# Patient Record
Sex: Female | Born: 1965 | Race: Black or African American | Hispanic: No | Marital: Married | Smoking: Never smoker
Health system: Southern US, Community
[De-identification: ages and names within clinical notes are randomized; demographics above are authoritative.]

## PROBLEM LIST (undated history)

## (undated) DIAGNOSIS — M542 Cervicalgia: Secondary | ICD-10-CM

## (undated) DIAGNOSIS — I10 Essential (primary) hypertension: Secondary | ICD-10-CM

## (undated) DIAGNOSIS — Z5189 Encounter for other specified aftercare: Secondary | ICD-10-CM

## (undated) DIAGNOSIS — E669 Obesity, unspecified: Secondary | ICD-10-CM

## (undated) DIAGNOSIS — J45909 Unspecified asthma, uncomplicated: Secondary | ICD-10-CM

## (undated) DIAGNOSIS — I509 Heart failure, unspecified: Secondary | ICD-10-CM

## (undated) DIAGNOSIS — G8929 Other chronic pain: Secondary | ICD-10-CM

## (undated) DIAGNOSIS — D649 Anemia, unspecified: Secondary | ICD-10-CM

## (undated) DIAGNOSIS — R202 Paresthesia of skin: Secondary | ICD-10-CM

## (undated) DIAGNOSIS — J40 Bronchitis, not specified as acute or chronic: Secondary | ICD-10-CM

---

## 2005-07-04 ENCOUNTER — Emergency Department (HOSPITAL_COMMUNITY): Admission: EM | Admit: 2005-07-04 | Discharge: 2005-07-04 | Payer: Self-pay | Admitting: Emergency Medicine

## 2006-04-13 ENCOUNTER — Emergency Department (HOSPITAL_COMMUNITY): Admission: EM | Admit: 2006-04-13 | Discharge: 2006-04-13 | Payer: Self-pay | Admitting: Emergency Medicine

## 2006-06-27 ENCOUNTER — Emergency Department (HOSPITAL_COMMUNITY): Admission: EM | Admit: 2006-06-27 | Discharge: 2006-06-27 | Payer: Self-pay | Admitting: Emergency Medicine

## 2007-08-28 ENCOUNTER — Emergency Department (HOSPITAL_COMMUNITY): Admission: EM | Admit: 2007-08-28 | Discharge: 2007-08-28 | Payer: Self-pay | Admitting: Emergency Medicine

## 2007-09-25 ENCOUNTER — Emergency Department (HOSPITAL_COMMUNITY): Admission: EM | Admit: 2007-09-25 | Discharge: 2007-09-26 | Payer: Self-pay | Admitting: Emergency Medicine

## 2008-01-31 ENCOUNTER — Emergency Department (HOSPITAL_COMMUNITY): Admission: EM | Admit: 2008-01-31 | Discharge: 2008-02-01 | Payer: Self-pay | Admitting: Emergency Medicine

## 2008-08-10 ENCOUNTER — Emergency Department (HOSPITAL_COMMUNITY): Admission: EM | Admit: 2008-08-10 | Discharge: 2008-08-10 | Payer: Self-pay | Admitting: Emergency Medicine

## 2008-08-11 ENCOUNTER — Emergency Department (HOSPITAL_COMMUNITY): Admission: EM | Admit: 2008-08-11 | Discharge: 2008-08-12 | Payer: Self-pay | Admitting: Emergency Medicine

## 2008-12-09 ENCOUNTER — Emergency Department (HOSPITAL_COMMUNITY): Admission: EM | Admit: 2008-12-09 | Discharge: 2008-12-09 | Payer: Self-pay | Admitting: Family Medicine

## 2009-04-11 ENCOUNTER — Emergency Department (HOSPITAL_COMMUNITY): Admission: EM | Admit: 2009-04-11 | Discharge: 2009-04-11 | Payer: Self-pay | Admitting: Emergency Medicine

## 2009-08-10 ENCOUNTER — Emergency Department (HOSPITAL_COMMUNITY): Admission: EM | Admit: 2009-08-10 | Discharge: 2009-08-10 | Payer: Self-pay | Admitting: Emergency Medicine

## 2009-12-19 DIAGNOSIS — J40 Bronchitis, not specified as acute or chronic: Secondary | ICD-10-CM

## 2009-12-19 HISTORY — DX: Bronchitis, not specified as acute or chronic: J40

## 2010-02-14 ENCOUNTER — Emergency Department (HOSPITAL_COMMUNITY): Admission: EM | Admit: 2010-02-14 | Discharge: 2010-02-14 | Payer: Self-pay | Admitting: Dermatology

## 2010-05-31 ENCOUNTER — Emergency Department (HOSPITAL_COMMUNITY): Admission: EM | Admit: 2010-05-31 | Discharge: 2010-06-01 | Payer: Self-pay | Admitting: Emergency Medicine

## 2010-07-20 ENCOUNTER — Emergency Department (HOSPITAL_COMMUNITY): Admission: EM | Admit: 2010-07-20 | Discharge: 2010-07-20 | Payer: Self-pay | Admitting: Emergency Medicine

## 2010-09-02 ENCOUNTER — Emergency Department (HOSPITAL_COMMUNITY): Admission: EM | Admit: 2010-09-02 | Discharge: 2010-09-02 | Payer: Self-pay | Admitting: Emergency Medicine

## 2010-09-12 ENCOUNTER — Emergency Department (HOSPITAL_COMMUNITY): Admission: EM | Admit: 2010-09-12 | Discharge: 2010-09-12 | Payer: Self-pay | Admitting: Emergency Medicine

## 2010-10-09 ENCOUNTER — Ambulatory Visit: Payer: Self-pay | Admitting: Nurse Practitioner

## 2010-10-09 ENCOUNTER — Emergency Department (HOSPITAL_COMMUNITY): Admission: EM | Admit: 2010-10-09 | Discharge: 2010-10-09 | Payer: Self-pay | Admitting: Emergency Medicine

## 2010-11-06 ENCOUNTER — Emergency Department (HOSPITAL_COMMUNITY): Admission: EM | Admit: 2010-11-06 | Discharge: 2010-11-07 | Payer: Self-pay | Admitting: Emergency Medicine

## 2010-12-03 ENCOUNTER — Emergency Department (HOSPITAL_COMMUNITY)
Admission: EM | Admit: 2010-12-03 | Discharge: 2010-12-03 | Payer: Self-pay | Source: Home / Self Care | Admitting: Emergency Medicine

## 2010-12-05 ENCOUNTER — Emergency Department (HOSPITAL_COMMUNITY)
Admission: EM | Admit: 2010-12-05 | Discharge: 2010-12-05 | Payer: Self-pay | Source: Home / Self Care | Admitting: Emergency Medicine

## 2010-12-05 ENCOUNTER — Encounter: Payer: Self-pay | Admitting: Ophthalmology

## 2010-12-14 ENCOUNTER — Emergency Department (HOSPITAL_COMMUNITY)
Admission: EM | Admit: 2010-12-14 | Discharge: 2010-12-14 | Payer: Self-pay | Source: Home / Self Care | Admitting: Emergency Medicine

## 2010-12-19 ENCOUNTER — Emergency Department (HOSPITAL_COMMUNITY)
Admission: EM | Admit: 2010-12-19 | Discharge: 2010-12-19 | Payer: Self-pay | Source: Home / Self Care | Admitting: Emergency Medicine

## 2010-12-26 ENCOUNTER — Emergency Department (HOSPITAL_COMMUNITY)
Admission: EM | Admit: 2010-12-26 | Discharge: 2010-12-26 | Payer: Self-pay | Source: Home / Self Care | Admitting: Emergency Medicine

## 2010-12-31 ENCOUNTER — Other Ambulatory Visit: Payer: Self-pay | Admitting: Internal Medicine

## 2010-12-31 ENCOUNTER — Emergency Department (HOSPITAL_COMMUNITY)
Admission: EM | Admit: 2010-12-31 | Discharge: 2010-12-31 | Payer: Self-pay | Source: Home / Self Care | Admitting: Emergency Medicine

## 2010-12-31 ENCOUNTER — Other Ambulatory Visit
Admission: RE | Admit: 2010-12-31 | Discharge: 2010-12-31 | Payer: Self-pay | Source: Home / Self Care | Admitting: Family Medicine

## 2011-01-03 LAB — URINALYSIS, ROUTINE W REFLEX MICROSCOPIC
Bilirubin Urine: NEGATIVE
Bilirubin Urine: NEGATIVE
Hgb urine dipstick: NEGATIVE
Ketones, ur: NEGATIVE mg/dL
Ketones, ur: NEGATIVE mg/dL
Leukocytes, UA: NEGATIVE
Nitrite: NEGATIVE
Nitrite: NEGATIVE
Protein, ur: NEGATIVE mg/dL
Protein, ur: >300 mg/dL — AB
Specific Gravity, Urine: 1.015 (ref 1.005–1.030)
Specific Gravity, Urine: 1.021 (ref 1.005–1.030)
Urine Glucose, Fasting: NEGATIVE mg/dL
Urine Glucose, Fasting: NEGATIVE mg/dL
Urobilinogen, UA: 0.2 mg/dL (ref 0.0–1.0)
Urobilinogen, UA: 0.2 mg/dL (ref 0.0–1.0)
pH: 6 (ref 5.0–8.0)
pH: 6 (ref 5.0–8.0)

## 2011-01-03 LAB — URINE MICROSCOPIC-ADD ON

## 2011-01-03 LAB — DIFFERENTIAL
Basophils Absolute: 0.1 10*3/uL (ref 0.0–0.1)
Basophils Absolute: 0.1 10*3/uL (ref 0.0–0.1)
Basophils Relative: 1 % (ref 0–1)
Basophils Relative: 1 % (ref 0–1)
Eosinophils Absolute: 0.4 10*3/uL (ref 0.0–0.7)
Eosinophils Absolute: 0.4 10*3/uL (ref 0.0–0.7)
Eosinophils Relative: 5 % (ref 0–5)
Eosinophils Relative: 5 % (ref 0–5)
Lymphocytes Relative: 18 % (ref 12–46)
Lymphocytes Relative: 18 % (ref 12–46)
Lymphs Abs: 1.4 10*3/uL (ref 0.7–4.0)
Lymphs Abs: 1.5 10*3/uL (ref 0.7–4.0)
Monocytes Absolute: 1 10*3/uL (ref 0.1–1.0)
Monocytes Absolute: 0.8 10*3/uL (ref 0.1–1.0)
Monocytes Relative: 13 % — ABNORMAL HIGH (ref 3–12)
Monocytes Relative: 10 % (ref 3–12)
Neutro Abs: 4.9 10*3/uL (ref 1.7–7.7)
Neutro Abs: 5.4 10*3/uL (ref 1.7–7.7)
Neutrophils Relative %: 63 % (ref 43–77)
Neutrophils Relative %: 66 % (ref 43–77)

## 2011-01-03 LAB — CBC
HCT: 27.6 % — ABNORMAL LOW (ref 36.0–46.0)
HCT: 30.6 % — ABNORMAL LOW (ref 36.0–46.0)
Hemoglobin: 9 g/dL — ABNORMAL LOW (ref 12.0–15.0)
Hemoglobin: 8.4 g/dL — ABNORMAL LOW (ref 12.0–15.0)
MCH: 23.5 pg — ABNORMAL LOW (ref 26.0–34.0)
MCH: 20.1 pg — ABNORMAL LOW (ref 26.0–34.0)
MCHC: 32.6 g/dL (ref 30.0–36.0)
MCHC: 27.5 g/dL — ABNORMAL LOW (ref 30.0–36.0)
MCV: 72.1 fL — ABNORMAL LOW (ref 78.0–100.0)
MCV: 73.4 fL — ABNORMAL LOW (ref 78.0–100.0)
Platelets: 335 10*3/uL (ref 150–400)
Platelets: 362 10*3/uL (ref 150–400)
RBC: 3.83 MIL/uL — ABNORMAL LOW (ref 3.87–5.11)
RBC: 4.17 MIL/uL (ref 3.87–5.11)
RDW: 17.9 % — ABNORMAL HIGH (ref 11.5–15.5)
RDW: 17.8 % — ABNORMAL HIGH (ref 11.5–15.5)
WBC: 7.7 10*3/uL (ref 4.0–10.5)
WBC: 8.2 10*3/uL (ref 4.0–10.5)

## 2011-01-03 LAB — COMPREHENSIVE METABOLIC PANEL
ALT: 21 U/L (ref 0–35)
ALT: 18 U/L (ref 0–35)
AST: 29 U/L (ref 0–37)
AST: 19 U/L (ref 0–37)
Albumin: 3 g/dL — ABNORMAL LOW (ref 3.5–5.2)
Albumin: 2.9 g/dL — ABNORMAL LOW (ref 3.5–5.2)
Alkaline Phosphatase: 45 U/L (ref 39–117)
Alkaline Phosphatase: 47 U/L (ref 39–117)
BUN: 8 mg/dL (ref 6–23)
BUN: 8 mg/dL (ref 6–23)
CO2: 29 mEq/L (ref 19–32)
CO2: 28 mEq/L (ref 19–32)
Calcium: 8.5 mg/dL (ref 8.4–10.5)
Calcium: 8.9 mg/dL (ref 8.4–10.5)
Chloride: 102 mEq/L (ref 96–112)
Chloride: 103 mEq/L (ref 96–112)
Creatinine, Ser: 1.07 mg/dL (ref 0.4–1.2)
Creatinine, Ser: 1.04 mg/dL (ref 0.4–1.2)
GFR calc Af Amer: >60 mL/min (ref >60)
GFR calc Af Amer: >60 mL/min (ref >60)
GFR calc non Af Amer: 55 mL/min — ABNORMAL LOW (ref >60)
GFR calc non Af Amer: 57 mL/min — ABNORMAL LOW (ref >60)
Glucose, Bld: 131 mg/dL — ABNORMAL HIGH (ref 70–99)
Glucose, Bld: 119 mg/dL — ABNORMAL HIGH (ref 70–99)
Potassium: 3.8 mEq/L (ref 3.5–5.1)
Potassium: 3.9 mEq/L (ref 3.5–5.1)
Sodium: 139 mEq/L (ref 135–145)
Sodium: 139 mEq/L (ref 135–145)
Total Bilirubin: 0.5 mg/dL (ref 0.3–1.2)
Total Bilirubin: 0.7 mg/dL (ref 0.3–1.2)
Total Protein: 6.6 g/dL (ref 6.0–8.3)
Total Protein: 5.9 g/dL — ABNORMAL LOW (ref 6.0–8.3)

## 2011-01-03 LAB — CK TOTAL AND CKMB (NOT AT ARMC)
CK, MB: 1.7 ng/mL (ref 0.3–4.0)
Relative Index: 1.3 (ref 0.0–2.5)
Total CK: 131 U/L (ref 7–177)

## 2011-01-03 LAB — BRAIN NATRIURETIC PEPTIDE
Pro B Natriuretic peptide (BNP): 171 pg/mL — ABNORMAL HIGH (ref 0.0–100.0)
Pro B Natriuretic peptide (BNP): 310 pg/mL — ABNORMAL HIGH (ref 0.0–100.0)

## 2011-01-03 LAB — TROPONIN I: Troponin I: 0.04 ng/mL (ref 0.00–0.06)

## 2011-01-03 LAB — PATHOLOGIST SMEAR REVIEW

## 2011-01-04 ENCOUNTER — Inpatient Hospital Stay (HOSPITAL_COMMUNITY): Admission: EM | Admit: 2011-01-04 | Discharge: 2011-01-09 | Payer: Self-pay | Source: Home / Self Care

## 2011-01-06 ENCOUNTER — Encounter (INDEPENDENT_AMBULATORY_CARE_PROVIDER_SITE_OTHER): Payer: Self-pay | Admitting: Internal Medicine

## 2011-01-10 LAB — DIFFERENTIAL
Basophils Absolute: 0 10*3/uL (ref 0.0–0.1)
Basophils Relative: 1 % (ref 0–1)
Eosinophils Absolute: 0.5 10*3/uL (ref 0.0–0.7)
Eosinophils Absolute: 0.5 10*3/uL (ref 0.0–0.7)
Eosinophils Relative: 6 % — ABNORMAL HIGH (ref 0–5)
Lymphocytes Relative: 20 % (ref 12–46)
Lymphs Abs: 1.6 10*3/uL (ref 0.7–4.0)
Lymphs Abs: 0.9 10*3/uL (ref 0.7–4.0)
Monocytes Absolute: 1 10*3/uL (ref 0.1–1.0)
Monocytes Relative: 12 % (ref 3–12)
Monocytes Relative: 13 % — ABNORMAL HIGH (ref 3–12)
Neutro Abs: 4.9 10*3/uL (ref 1.7–7.7)
Neutrophils Relative %: 62 % (ref 43–77)
Neutrophils Relative %: 64 % (ref 43–77)

## 2011-01-10 LAB — BASIC METABOLIC PANEL
BUN: 4 mg/dL — ABNORMAL LOW (ref 6–23)
BUN: 8 mg/dL (ref 6–23)
CO2: 32 mEq/L (ref 19–32)
Calcium: 8.8 mg/dL (ref 8.4–10.5)
Chloride: 99 mEq/L (ref 96–112)
Chloride: 98 mEq/L (ref 96–112)
GFR calc Af Amer: >60 mL/min (ref >60)
GFR calc non Af Amer: 56 mL/min — ABNORMAL LOW (ref >60)
Glucose, Bld: 145 mg/dL — ABNORMAL HIGH (ref 70–99)
Potassium: 3.7 mEq/L (ref 3.5–5.1)
Potassium: 3.3 mEq/L — ABNORMAL LOW (ref 3.5–5.1)
Potassium: 3.6 mEq/L (ref 3.5–5.1)
Sodium: 140 mEq/L (ref 135–145)

## 2011-01-10 LAB — HEPATIC FUNCTION PANEL
ALT: 24 U/L (ref 0–35)
AST: 24 U/L (ref 0–37)
Albumin: 2.9 g/dL — ABNORMAL LOW (ref 3.5–5.2)
Total Protein: 6.5 g/dL (ref 6.0–8.3)

## 2011-01-10 LAB — CBC
HCT: 30.8 % — ABNORMAL LOW (ref 36.0–46.0)
Hemoglobin: 8.8 g/dL — ABNORMAL LOW (ref 12.0–15.0)
Hemoglobin: 9.1 g/dL — ABNORMAL LOW (ref 12.0–15.0)
MCH: 21.1 pg — ABNORMAL LOW (ref 26.0–34.0)
MCH: 20.9 pg — ABNORMAL LOW (ref 26.0–34.0)
MCHC: 28.6 g/dL — ABNORMAL LOW (ref 30.0–36.0)
MCV: 73.9 fL — ABNORMAL LOW (ref 78.0–100.0)
MCV: 73.4 fL — ABNORMAL LOW (ref 78.0–100.0)
Platelets: 412 10*3/uL — ABNORMAL HIGH (ref 150–400)
RBC: 4.17 MIL/uL (ref 3.87–5.11)
RBC: 4.36 MIL/uL (ref 3.87–5.11)
RDW: 18.9 % — ABNORMAL HIGH (ref 11.5–15.5)
WBC: 8 10*3/uL (ref 4.0–10.5)

## 2011-01-10 LAB — IRON AND TIBC: UIBC: >450 ug/dL

## 2011-01-10 LAB — VITAMIN B12: Vitamin B-12: 497 pg/mL (ref 211–911)

## 2011-01-10 LAB — CARDIAC PANEL(CRET KIN+CKTOT+MB+TROPI)
CK, MB: 1.9 ng/mL (ref 0.3–4.0)
CK, MB: 2.1 ng/mL (ref 0.3–4.0)
CK, MB: 1.9 ng/mL (ref 0.3–4.0)
Relative Index: INVALID (ref 0.0–2.5)
Total CK: 85 U/L (ref 7–177)
Troponin I: 0.03 ng/mL (ref 0.00–0.06)

## 2011-01-10 LAB — URINALYSIS, ROUTINE W REFLEX MICROSCOPIC
Bilirubin Urine: NEGATIVE
Hgb urine dipstick: NEGATIVE
Ketones, ur: NEGATIVE mg/dL
Leukocytes, UA: NEGATIVE
Nitrite: NEGATIVE
Protein, ur: 30 mg/dL — AB
Specific Gravity, Urine: >1.03 — ABNORMAL HIGH (ref 1.005–1.030)
Urine Glucose, Fasting: NEGATIVE mg/dL
Urobilinogen, UA: 0.2 mg/dL (ref 0.0–1.0)
pH: 5.5 (ref 5.0–8.0)

## 2011-01-10 LAB — FOLATE: Folate: 15.3 ng/mL

## 2011-01-10 LAB — FERRITIN: Ferritin: 8 ng/mL — ABNORMAL LOW (ref 10–291)

## 2011-01-10 LAB — PREGNANCY, URINE: Preg Test, Ur: NEGATIVE

## 2011-01-10 LAB — BRAIN NATRIURETIC PEPTIDE: Pro B Natriuretic peptide (BNP): 255 pg/mL — ABNORMAL HIGH (ref 0.0–100.0)

## 2011-01-10 LAB — URINE MICROSCOPIC-ADD ON

## 2011-01-10 NOTE — Discharge Summary (Addendum)
[name], HARDRICK               ACCOUNT NO.:  [id number]  MEDICAL RECORD NO.:  [id number]          PATIENT TYPE:  INP  LOCATION:  A218                          FACILITY:  [facility]  PHYSICIAN:  Wilson Singer, M.D.DATE OF BIRTH:  1966-06-03  DATE OF ADMISSION:  01/05/2011 DATE OF DISCHARGE:  01/22/2012LH                              DISCHARGE SUMMARY   FINAL DISCHARGE DIAGNOSES: 1. Systolic congestive heart failure, ejection fraction 28%. 2. Iron deficiency anemia secondary to menorrhagia. 3. Obesity. 4. Hypertension.  CONDITION ON DISCHARGE:  Stable.  MEDICATIONS ON DISCHARGE: 1. Aspirin 81 mg daily. 2. Carvedilol 3.125 mg b.i.d. 3. Furosemide 40 mg b.i.d. 4. Ferrous sulfate 325 mg daily. 5. Lisinopril 10 mg daily. 6. Potassium chloride 40 mEq daily. 7. Spironolactone 25 mg daily. 8. Ondansetron 8 mg every 4 hours p.r.n. 9. Tussionex extended release oral suspension 1 teaspoon every 12     hours p.r.n.  HISTORY:  This very pleasant 45 year old lady who was admitted to the hospital complaining of leg swelling, abdominal swelling, 44-month to 1- year history of dyspnea on exertion, orthopnea, and paroxysmal nocturnal dyspnea.  Please see initial history and examination done by Dr. Donnalee Curry.  HOSPITAL PROGRESS:  The patient was admitted for her peripheral edema and anasarca.  She was diuresed with intravenous Lasix.  There was a suspicion that in fact this lady may have congestive heart failure. Echocardiogram was done which showed that there was severely reduced left ventricular systolic function with ejection fraction between 20-30% with severe global hypokinesis and in this regard, she was then seen by Dr. Dietrich Pates who agreed and made additions to medications.  She also was found to have an iron deficiency anemia which is likely due to her menorrhagia that she still continues to have.  Fecal alcohol blood test were negative and she does not have any history of  GI bleeding.  During her hospitalization, she has lost at least 10 kg.  They do not have today's weight, but the most recent weight shows that she weighed 138 kg from admission weight of 145 kg.  She says that her baseline weight is approximately 260 pounds.  On the day of discharge, she looked well. She was not dyspneic at rest and was keen to go home.  PHYSICAL EXAMINATION:  VITAL SIGNS:  Temperature 98.1, blood pressure 112/66, pulse 89, saturation 94% on room air. CARDIOVASCULAR:  Heart sounds are present and normal without murmurs. Jugular venous pressure was not raised today. LUNGS:  Lung fields are clear.  Discharging blood work shows sodium 136, potassium 3.7, bicarbonate 28, BUN 10, creatinine 1.05.  Interestingly, her TSH was marginally elevated at 6.96 and this should be followed by primary care physician, Dr. Nehemiah Settle.  DISPOSITION:  The patient is stable now to be discharged home and followup must be with the cardiologist in about 1 week and primary care physician in 2 weeks.  She will need to continue her medications and followup as appropriate.     Wilson Singer, M.D.     NCG/MEDQ  D:  01/09/2011  T:  01/09/2011  Job:  [id number]  cc:  Deirdre Peer. Polite, M.D.  Gerrit Friends. Dietrich Pates, MD, [facility] [address] [place], [place] [address]  Electronically Signed by Lilly Cove M.D. on 01/10/2011 12:57:36 PM

## 2011-01-11 LAB — HEPATIC FUNCTION PANEL
AST: 19 U/L (ref 0–37)
Albumin: 3.2 g/dL — ABNORMAL LOW (ref 3.5–5.2)
Alkaline Phosphatase: 45 U/L (ref 39–117)
Total Bilirubin: 0.5 mg/dL (ref 0.3–1.2)

## 2011-01-11 LAB — BASIC METABOLIC PANEL
BUN: 10 mg/dL (ref 6–23)
Chloride: 100 mEq/L (ref 96–112)
Glucose, Bld: 104 mg/dL — ABNORMAL HIGH (ref 70–99)
Potassium: 3.7 mEq/L (ref 3.5–5.1)

## 2011-01-13 NOTE — H&P (Addendum)
[name], [name]               ACCOUNT NO.:  [id number]  MEDICAL RECORD NO.:  [id number]          PATIENT TYPE:  INP  LOCATION:  A218                          FACILITY:  [facility]  PHYSICIAN:  Kela Millin, M.D.DATE OF BIRTH:  1966-11-25  DATE OF ADMISSION:  01/04/2011 DATE OF DISCHARGE:  LH                             HISTORY & PHYSICAL   PRIMARY CARE PHYSICIAN:  Dr. Renford Dills.  CHIEF COMPLAINT:  Abdominal pain, also leg swelling.  HISTORY OF PRESENT ILLNESS:  The patient is a 45 year old morbidly obese black female who presents with the above complaints.  She states that for the past 3 days she has had the diffuse abdominal pain described as sharp, constant and worsened early this a.m. when she came to the ED to 10 out of 10 in intensity.  She admits to associated nausea and vomiting, stating that each time she was tried to eat she would vomit, nonbloody.  She denies diarrhea.  She states that a couple of weeks ago they all had a virus in her household and that was associated with diarrhea.  She admits to a cough productive of phlegm, and subjective fevers.  She denies dysuria.  The patient also reports that she has had leg swelling for about 1 month now and about 3 weeks ago she was started on Lasix by her primary care physician.  She admits to PND, stating that she has had to be sleeping in a chair lately because of that.  She also admits to dyspnea on exertion.  She denies chest pain, dizziness, focal weakness and no blurry vision.  She admits to menorrhagia.  She denies melena and no hematochezia.  The patient was seen in the ED and a CT scan of her abdomen was done and revealed diffuse soft tissue edema consistent with anasarca.  No evidence of bowel obstruction or focal fluid collection.  Hepatic steatosis.  A brain natriuretic peptide was done and it was 255.  Her lipase within normal limits at 19 and her LFTs unremarkable except for an albumin of 2.9, and her  renal function within normal limits.  Her urinalysis was negative for infection.  She is admitted for further evaluation and management.  PAST MEDICAL HISTORY: 1. Hypertension. 2. History of microcytic anemia. 3. As above.  MEDICATIONS: 1. Zofran. 2. KCl 20 mEq daily. 3. Lasix, she does not know the dose. 4. Tussionex 10 mL p.r.n. 5. Iron 27 mg daily. 6. Blood pressure pill.  ALLERGIES:  NKDA.  SOCIAL HISTORY:  She denies tobacco.  She denies alcohol.  FAMILY HISTORY:  Her mom and dad have hypertension, her dad has diabetes and her mom is status post pacemaker.  REVIEW OF SYSTEMS:  As per HPI. Other review of systems negative.  PHYSICAL EXAMINATION:  GENERAL:  The patient is a morbidly obese black female, in no respiratory distress. VITAL SIGNS: Temperature is 98.9 with a blood pressure of 131/68, pulse 108, respiratory rate is 20, O2 sat of 96%. HEENT:  PERRL, EOMI, moist mucous membranes.  No oral exudates. NECK:  Supple, no adenopathy, no thyromegaly and no JVD. LUNGS:  Decreased breath  sounds at the bases, no wheezes and no crackles appreciated. CARDIOVASCULAR:  Regular rate and rhythm, normal S1 and S2. ABDOMEN:  Obese, marked abdominal wall edema, epigastric/abdominal tenderness present, no rebound tenderness.  No masses palpable. EXTREMITIES:  Tender, about +2 edema.  No cyanosis. NEURO:  She is alert and oriented x3.  Cranial nerves II-XII grossly intact.  Nonfocal exam.  LABORATORY DATA:  Her urinalysis is negative for infection.  White cell count is 8 with a hemoglobin of 8.8, hematocrit of 30.8, platelet count 412.  Her MCV 73.9  Sodium is 140 with a potassium of 3.7, chloride of 105, glucose of 98, creatinine of 1.05, calcium is 8.8.  Her total bilirubin is 0.4, direct bilirubin 0.2, indirect is 0.2, alkaline phosphatase is 46, SGOT is 24, SGPT is 24, albumin 2.9, lipase is 19, brain natriuretic peptide is 255.  ASSESSMENT AND PLAN: 1. Anasarca:  As  discussed above, this patient presenting with dyspnea     on exertion, PND, peripheral edema and abdominal wall edema on     exam.  Her brain natriuretic peptide is only mildly elevated at 255     but it is noted that she has been on Lasix for the past 3 weeks.     Her LFTs and renal function are within normal limits.  Will obtain     a 2-D echo, cardiac enzymes, follow and further recommendations     pending at the studies.  Will continue diuresing with Lasix at this     time.  If the above workup negative, she may need a sleep study     outpatient to evaluate for possible sleep apnea. 2. Abdominal pain, gastroesophageal reflux disease versus     gastroenteritis.  She has epigastric tenderness on exam.  Her     lipase is within normal limits as well as her liver function tests     and the urinalysis shows no infection.  The CT scan of her abdomen     is as above with hepatic steatosis and anasarca.  Will place the     patient on a PPI, supportive care and follow. 3. Hypertension.  Monitor and further treat accordingly.  She does not     recall the name of her blood pressure     medication.  Obtain and resume. 4. Microcytic anemia.  She admits to menorrhagia, but will also check     an anemia panel, stool guaiacs, follow and further treat     accordingly.     Kela Millin, M.D.     ACV/MEDQ  D:  01/05/2011  T:  01/05/2011  Job:  [id number]  cc:   Deirdre Peer. Polite, M.D.  Electronically Signed by Donnalee Curry M.D. on 01/13/2011 09:23:51 AM

## 2011-01-24 NOTE — Consult Note (Addendum)
[name], [name] NO.:  [id number]  MEDICAL RECORD NO.:  [id number]          PATIENT TYPE:  INP  LOCATION:  A218                          FACILITY:  [facility]  PHYSICIAN:  Gerrit Friends. Dietrich Pates, MD, FACCDATE OF BIRTH:  04-15-1966  DATE OF CONSULTATION:  01/07/2011 DATE OF DISCHARGE:                                CONSULTATION   REASON FOR CONSULTATION:  Cardiomyopathy with an EF of 28% for further recommendations.  HISTORY OF PRESENT ILLNESS:  This is a 45 year old morbidly obese African American female with no prior cardiac history, who was admitted with anasarca.  She had been placed on IV Lasix 40 mg q.8 hours, with good diuresis with a weight decrease of 8 kg.  Echocardiogram was completed during this admission on January 06, 2011, revealing an EF of 20-30% with LVH.  We were requested for consultation to make recommendations and questionable need for catheterization.  The patient has been seen in the emergency room multiple times over the last year and has also been seen at [facility], where she was admitted.  She states she had an echocardiogram at that time.  We will request records for that.  The patient states that she was told that she had bronchitis and some mild fluid overloaded, and was placed on some p.o. Lasix.  However, her symptoms persisted and became substantially worse over the last few weeks.  Unable to lay down in the bed or breathe well, with increasing lower extremity edema up into her thighs.  As a result, she came to the emergency room.  On arrival, the patient's EKG revealed sinus tachycardia.  She was also found to have mild pulmonary vascular congestion, but no pulmonary edema.  REVIEW OF SYSTEMS:  Positive for shortness of breath, dyspnea on exertion, orthopnea, edema and cough.  All other systems were reviewed and found to be negative, unless listed above.  CODE STATUS:  Full.  PAST MEDICAL HISTORY:  Recently diagnosed  with hypertension within the last 6 months, recent diagnosis of microcytic anemia, diagnosis of bronchitis.  PAST SURGICAL HISTORY:  C-section, echocardiogram dated January 06, 2011 with EF 20-30% with severe global hypokinesis.  SOCIAL HISTORY:  She lives in [place] with her husband.  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  She is married with three children. She has never smoked, drank or used drugs.  FAMILY HISTORY:  Mother has recently had a pacemaker placed for tachybrady syndrome.  Father with hypertension and CAD, with stent placement.  She has one brother with diabetes.  CURRENT MEDICATIONS PRIOR TO ADMISSION:  Lasix 80 mg daily, potassium 20 mEq daily and Tussionex.  ALLERGIES:  NO KNOWN DRUG ALLERGIES.  CURRENT LABS:  Sodium 138, potassium 3.3, chloride 99, CO2 32, BUN 4, creatinine 1.0, glucose 139.  Hemoglobin 9.1, hematocrit 32.0, white blood cells 6.1, platelets 455.  BNP on admit 255.  Troponin 0.05, 0.04 and 0.03 respectively.  EKG revealing sinus tachycardia, with PACs, rate of 106 beats per minute.  Chest x-ray revealing a enlarged cardiac silhouette, with pulmonary vascular congestion.  PHYSICAL EXAMINATION:  VITAL SIGNS: Blood pressure 148/90, heart  rate 129, respirations 20, temperature 97.7, O2 sat 92% on room air.  Current weight 137.9 kg (admission weight 145.2 kg). GENERAL: She is awake, alert and oriented, in no acute distress. HEENT: Head is normocephalic and atraumatic.  Eyes:  PERRLA. NECK: Supple.  Obese.  No JVD.  No carotid bruits appreciated. CARDIOVASCULAR: Regular rate and rhythm, tachycardic.  Distant heart sounds.  No rubs or gallops noted..  Pulses are 2+ and equal. LUNGS: Some mild crackles, which are not very prominent.  Lungs essentially otherwise clear to auscultation. ABDOMEN: Soft and nontender, with 2+ bowel sounds.  No distention or ascites. EXTREMITIES: 2+ pitting edema up to the knees  bilaterally. MUSCULOSKELETAL: No joint deformity or effusions. NEURO: Cranial nerves II-XII are grossly intact.  IMPRESSION: 1. Acute systolic congestive heart failure:  Rule out hypertensive     cardiomyopathy versus ischemia.  Negative cardiac enzymes at this     time.  Will add Coreg 3.25 mg b.i.d. for heart rate control.  Also     add spirolactone.  She is diuresing well, but may need to add     inotrope for better diuresis and cardiac output showed her diuresis     slow.  For now keep her on IV Lasix.  I agree with ACE inhibitor.     Will also check a TSH. 2. Iron deficiency anemia:  Anemia profile revealed low iron is 17 and     this is being repleted at this point.  The patient dates onset of illness about 1 year ago, consistentwith congestive heart failure,   but no pulmonary edema.  Gradual onset  uncertain whether she has had previous cardiac has had  previous cardiac studies.  We are requesting records from [place]. Now, none of the usual items   in the differential diagnosis appearlikely except, Pickwickian syndrome and right heart failure,   but that does not account for decreased ejection fraction.  Will adjustmedications and follow   with you, with consideration for cardiac catheterization once she is euvolemic.  On behalf of the physicians and providers of [facility], we would like to thank the [facility] for allowing [place] to participate in the care of this patient.     Bettey Mare. Lyman Bishop, NP   ______________________________ Gerrit Friends. Dietrich Pates, MD, [facility]    KML/MEDQ  D:  01/07/2011  T:  01/07/2011  Job:  [id number]  Electronically Signed by Joni Reining NP on 01/10/2011 04:48:01 PM Electronically Signed by Cushing Bing MD [facility] on 01/24/2011 08:17:43 AM

## 2011-02-07 ENCOUNTER — Inpatient Hospital Stay (HOSPITAL_COMMUNITY)
Admission: EM | Admit: 2011-02-07 | Discharge: 2011-02-09 | DRG: 812 | Disposition: A | Discharge status: Home / Self Care | Payer: No Typology Code available for payment source | Attending: Family Medicine | Admitting: Family Medicine

## 2011-02-07 DIAGNOSIS — I10 Essential (primary) hypertension: Secondary | ICD-10-CM | POA: Diagnosis present

## 2011-02-07 DIAGNOSIS — I5022 Chronic systolic (congestive) heart failure: Secondary | ICD-10-CM | POA: Diagnosis present

## 2011-02-07 DIAGNOSIS — D500 Iron deficiency anemia secondary to blood loss (chronic): Principal | ICD-10-CM | POA: Diagnosis present

## 2011-02-07 DIAGNOSIS — N920 Excessive and frequent menstruation with regular cycle: Secondary | ICD-10-CM | POA: Diagnosis present

## 2011-02-07 DIAGNOSIS — I509 Heart failure, unspecified: Secondary | ICD-10-CM | POA: Diagnosis present

## 2011-02-07 LAB — DIFFERENTIAL
Basophils Absolute: 0 10*3/uL (ref 0.0–0.1)
Eosinophils Absolute: 0.2 10*3/uL (ref 0.0–0.7)
Eosinophils Relative: 3 % (ref 0–5)
Lymphocytes Relative: 20 % (ref 12–46)

## 2011-02-07 LAB — WET PREP, GENITAL
Clue Cells Wet Prep HPF POC: NONE SEEN
Yeast Wet Prep HPF POC: NONE SEEN

## 2011-02-07 LAB — ABO/RH: ABO/RH(D): A POS

## 2011-02-07 LAB — BASIC METABOLIC PANEL
GFR calc non Af Amer: 47 mL/min — ABNORMAL LOW (ref >60)
Potassium: 3.3 mEq/L — ABNORMAL LOW (ref 3.5–5.1)
Sodium: 137 mEq/L (ref 135–145)

## 2011-02-07 LAB — CBC
HCT: 25.3 % — ABNORMAL LOW (ref 36.0–46.0)
Platelets: 280 10*3/uL (ref 150–400)
RDW: 23.2 % — ABNORMAL HIGH (ref 11.5–15.5)
WBC: 7.7 10*3/uL (ref 4.0–10.5)

## 2011-02-08 ENCOUNTER — Inpatient Hospital Stay (HOSPITAL_COMMUNITY): Payer: No Typology Code available for payment source

## 2011-02-08 LAB — RPR: RPR Ser Ql: NONREACTIVE

## 2011-02-08 NOTE — H&P (Signed)
[name], [name]               ACCOUNT NO.:  [id number]  MEDICAL RECORD NO.:  [id number]           PATIENT TYPE:  I  LOCATION:  A302                          FACILITY:  [facility]  PHYSICIAN:  Wilson Singer, M.D.DATE OF BIRTH:  03-11-1966  DATE OF ADMISSION:  02/07/2011 DATE OF DISCHARGE:  LH                             HISTORY & PHYSICAL   CHIEF COMPLAINT:  Menorrhagia.  HISTORY OF PRESENTING ILLNESS:  The patient who is 45 years old presents with a 3- to 4-day history of menorrhagia.  She is known to have heavy periods and infection when she was hospitalized on January 05, 2011 when final diagnosis was one of systolic congestive heart failure.  It was noted that she was iron-deficient anemic at that time most likely due to menorrhagia.  In fact, fecal alcohol bloods were negative and she was advised to see a gynecologist for further management of this.  However, her systolic congestive heart failure (ejection fraction 28%) has been obviously the most pressing issue and she has been seen the cardiologist to adjust medications for her heart failure.  She is due to have cardiac catheterization in the beginning of March to further evaluate to see if this is nonischemic or ischemic coronary artery disease.  When she was seen in the emergency room, it was noted that her hemoglobin was 7.6, which was significantly down from the previous hemoglobin on January 06, 2011 of 9.1.  She says that she feels somewhat tired, but not particularly increase short of breath as her medications for heart failure have been adjusted to a degree.  PAST MEDICAL HISTORY: 1. Recent diagnosis of systolic congestive heart failure in January. 2. Iron-deficiency anemia due to menorrhagia. 3. Obesity. 4. Hypertension.  ALLERGIES:  None.  MEDICATIONS: 1. Spironolactone 25 mg daily. 2. Potassium chloride 20 mEq daily. 3. Ferrous sulfate 325 mg b.i.d. 4. Carvedilol 3.125 mg b.i.d. 5. Lasix or furosemide  80 mg b.i.d. 6. Aspirin 81 mg daily. 7. Diovan 160 mg daily.  SOCIAL HISTORY:  The patient is married and currently has gone back to work.  She does not drink alcohol.  She does not smoke cigarettes.  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  She has three children.  PAST SURGICAL HISTORY:  Cesarean section.  FAMILY HISTORY:  Mother recently had a pacemaker placement for tachybrady syndrome.  Father has hypertension and coronary artery disease with stent placement.  She has one brother with diabetes.  REVIEW OF SYSTEMS:  Apart from the symptoms mentioned above, there are no other symptoms referable to all systems reviewed.  PHYSICAL EXAMINATION:  VITAL SIGNS:  Temperature 98.6, blood pressure 127/72, pulse 80 and sinus rhythm, respiratory rate 12-14, saturation 100% on room air.  She looks somewhat pale. CARDIOVASCULAR:  Heart sounds are present and normal.  Jugular venous pressure is not raised. LUNGS:  Lung fields are clear today.  There is some peripheral pitting edema, which is slightly tender. ABDOMEN:  Soft and nontender with no hepatosplenomegaly. NEUROLOGIC:  She is alert and oriented without any focal neurologic signs. SKIN:  There are  no obvious skin lesions or rashes.  INVESTIGATIONS:  Sodium 137, potassium 3.3, bicarbonate 30, glucose 122, BUN 12, creatinine 1.24.  Hemoglobin 7.6, white blood cell count 7.7, platelets 280.  PROBLEM LIST: 1. Severe anemia secondary to menorrhagia. 2. Systolic congestive heart failure. 3. Hypertension.  PLAN: 1. Admit. 2. Give 2 units of blood. 3. Gyn consult.  Further recommendations will depend on the patient's hospital progress.     Wilson Singer, M.D.     NCG/MEDQ  D:  02/07/2011  T:  02/08/2011  Job:  [id number]  cc:   Deirdre Peer. Polite, M.D.  Corky Crafts, MD Fax: [phone]  Electronically Signed by Lilly Cove M.D. on 02/08/2011 02:27:33 PM

## 2011-02-09 LAB — CBC
Hemoglobin: 8.9 g/dL — ABNORMAL LOW (ref 12.0–15.0)
MCH: 25.1 pg — ABNORMAL LOW (ref 26.0–34.0)
MCHC: 31.2 g/dL (ref 30.0–36.0)
MCV: 80.3 fL (ref 78.0–100.0)

## 2011-02-09 LAB — COMPREHENSIVE METABOLIC PANEL
AST: 18 U/L (ref 0–37)
BUN: 18 mg/dL (ref 6–23)
CO2: 31 mEq/L (ref 19–32)
Calcium: 8.6 mg/dL (ref 8.4–10.5)
Creatinine, Ser: 1.42 mg/dL — ABNORMAL HIGH (ref 0.4–1.2)
GFR calc Af Amer: 48 mL/min — ABNORMAL LOW (ref >60)
GFR calc non Af Amer: 40 mL/min — ABNORMAL LOW (ref >60)
Glucose, Bld: 85 mg/dL (ref 70–99)
Total Bilirubin: 0.4 mg/dL (ref 0.3–1.2)

## 2011-02-09 LAB — CROSSMATCH
Donor AG Type: NEGATIVE
Donor AG Type: NEGATIVE

## 2011-02-09 LAB — DIFFERENTIAL
Basophils Relative: 0 % (ref 0–1)
Lymphs Abs: 1.6 10*3/uL (ref 0.7–4.0)
Monocytes Absolute: 0.7 10*3/uL (ref 0.1–1.0)
Monocytes Relative: 8 % (ref 3–12)
Neutro Abs: 6.3 10*3/uL (ref 1.7–7.7)

## 2011-02-12 NOTE — Discharge Summary (Signed)
  NAMEELISHEBA, [name] NO.:  [id number]  MEDICAL RECORD NO.:  [id number]          PATIENT TYPE:  INP  LOCATION:  A302                          FACILITY:  [facility]  PHYSICIAN:  Tarry Kos, MD       DATE OF BIRTH:  18-Jun-1966  DATE OF ADMISSION: DATE OF DISCHARGE:  LH                              DISCHARGE SUMMARY   ADDENDUM  PHYSICAL EXAMINATION:  VITAL SIGNS:  Afebrile.  Stable. GENERAL:  Alert and oriented x4.  No apparent distress, cooperative and friendly. COR:  Regular rate and rhythm without murmurs, rubs or gallops. CHEST:  Clear to auscultation bilaterally.  No wheezing, rhonchi, or rales. ABDOMEN:  Soft, nontender, and nondistended.  Positive bowel sounds.  No hepatosplenomegaly. EXTREMITIES:  No clubbing, cyanosis, or edema. PSYCH:  Normal mood and affect. NEURO:  No focal neurological deficits. SKIN:  No rashes.                                           ______________________________ Tarry Kos, MD     RD/MEDQ  D:  02/09/2011  T:  02/10/2011  Job:  [id number]  Electronically Signed by Eldridge Dace MD on 02/12/2011 01:56:33 PM

## 2011-02-12 NOTE — Discharge Summary (Signed)
  NAMEDELORES, EDELSTEIN NO.:  [id number]  MEDICAL RECORD NO.:  [id number]           PATIENT TYPE:  I  LOCATION:  A302                          FACILITY:  [facility]  PHYSICIAN:  Tarry Kos, MD       DATE OF BIRTH:  Jun 18, 1966  DATE OF ADMISSION:  02/07/2011 DATE OF DISCHARGE:  02/22/2012LH                              DISCHARGE SUMMARY   DISCHARGE DIAGNOSES: 1. Menorrhagia. 2. Anemia secondary to menorrhagia. 3. History of congestive heart failure, compensated.  SUMMARY OF HOSPITAL COURSE:  Ms. Jenning is a pleasant 45 year old African American female who has a history of congestive heart failure with an EF of 28% along with longstanding menorrhagia who presented to the emergency room with a hemoglobin of 7.6.  She needed a blood transfusion.  Gynecology was consulted, it was recommended to place her on Megace and have outpatient followup for possible ablation in the future.  Ms. Lemonds was previously supposed to follow up outpatient with her gynecologist and I have stressed the importance of her following up with them as chronic anemia is not good particularly if there is a cause that can be fixed with regards to her heart failure. She was given 2 units of blood and her hemoglobin this morning was 8.9. She is being discharged home to follow up with her primary care physician in 1 week and also to follow up with the gynecology in 2-4 weeks.  Her discharge med reconciliation is the same as on admission, there have been no changes.  She had an ultrasound of her pelvis here, which showed prominent junctional zone in the uterus, adenomyosis is not excluded, essential fibroid, benign-appearing cyst in the right ovary. She also had an elevated TSH on admission at 6.963.  A free T4 was not checked, this will need to be follow up with her PCP with sending of a free T4.  The patient is being discharged to home in stable condition and is strongly encouraged to keep her  follow up in the future.                                           ______________________________ Tarry Kos, MD     RD/MEDQ  D:  02/09/2011  T:  02/10/2011  Job:  [id number]  Electronically Signed by Eldridge Dace MD on 02/12/2011 01:56:30 PM

## 2011-02-28 LAB — URINALYSIS, ROUTINE W REFLEX MICROSCOPIC
Glucose, UA: NEGATIVE mg/dL
Ketones, ur: NEGATIVE mg/dL
Leukocytes, UA: NEGATIVE
Nitrite: NEGATIVE
Nitrite: NEGATIVE
Specific Gravity, Urine: >1.03 — ABNORMAL HIGH (ref 1.005–1.030)
Specific Gravity, Urine: 1.015 (ref 1.005–1.030)
Urobilinogen, UA: 0.2 mg/dL (ref 0.0–1.0)
pH: 5.5 (ref 5.0–8.0)
pH: 7 (ref 5.0–8.0)

## 2011-02-28 LAB — DIFFERENTIAL
Lymphocytes Relative: 17 % (ref 12–46)
Lymphs Abs: 1.5 10*3/uL (ref 0.7–4.0)
Monocytes Relative: 10 % (ref 3–12)
Neutro Abs: 6 10*3/uL (ref 1.7–7.7)
Neutrophils Relative %: 68 % (ref 43–77)

## 2011-02-28 LAB — CBC
Hemoglobin: 9.4 g/dL — ABNORMAL LOW (ref 12.0–15.0)
Platelets: 506 10*3/uL — ABNORMAL HIGH (ref 150–400)
RBC: 4.55 MIL/uL (ref 3.87–5.11)
WBC: 8.9 10*3/uL (ref 4.0–10.5)

## 2011-02-28 LAB — BASIC METABOLIC PANEL
CO2: 31 mEq/L (ref 19–32)
Calcium: 8.7 mg/dL (ref 8.4–10.5)
Creatinine, Ser: 0.95 mg/dL (ref 0.4–1.2)
GFR calc Af Amer: >60 mL/min (ref >60)
Sodium: 136 mEq/L (ref 135–145)

## 2011-02-28 LAB — COMPREHENSIVE METABOLIC PANEL
ALT: 25 U/L (ref 0–35)
Alkaline Phosphatase: 43 U/L (ref 39–117)
BUN: 9 mg/dL (ref 6–23)
CO2: 29 mEq/L (ref 19–32)
Calcium: 8.6 mg/dL (ref 8.4–10.5)
GFR calc non Af Amer: 55 mL/min — ABNORMAL LOW (ref >60)
Glucose, Bld: 125 mg/dL — ABNORMAL HIGH (ref 70–99)
Total Protein: 6.1 g/dL (ref 6.0–8.3)

## 2011-02-28 LAB — POCT I-STAT, CHEM 8
BUN: 8 mg/dL (ref 6–23)
Creatinine, Ser: 1 mg/dL (ref 0.4–1.2)
Hemoglobin: 10.9 g/dL — ABNORMAL LOW (ref 12.0–15.0)
Potassium: 3.8 mEq/L (ref 3.5–5.1)
Sodium: 141 mEq/L (ref 135–145)
TCO2: 30 mmol/L (ref 0–100)

## 2011-02-28 LAB — TROPONIN I: Troponin I: 0.05 ng/mL (ref 0.00–0.06)

## 2011-02-28 LAB — URINE MICROSCOPIC-ADD ON

## 2011-02-28 LAB — BRAIN NATRIURETIC PEPTIDE: Pro B Natriuretic peptide (BNP): 362 pg/mL — ABNORMAL HIGH (ref 0.0–100.0)

## 2011-03-02 LAB — DIFFERENTIAL
Basophils Relative: 0 % (ref 0–1)
Eosinophils Relative: 1 % (ref 0–5)
Lymphocytes Relative: 13 % (ref 12–46)
Monocytes Relative: 6 % (ref 3–12)
Neutro Abs: 4.3 10*3/uL (ref 1.7–7.7)
Neutrophils Relative %: 80 % — ABNORMAL HIGH (ref 43–77)

## 2011-03-02 LAB — D-DIMER, QUANTITATIVE: D-Dimer, Quant: 0.34 ug/mL-FEU (ref 0.00–0.48)

## 2011-03-02 LAB — CBC
HCT: 31.5 % — ABNORMAL LOW (ref 36.0–46.0)
Hemoglobin: 9.8 g/dL — ABNORMAL LOW (ref 12.0–15.0)
RBC: 4.38 MIL/uL (ref 3.87–5.11)
WBC: 5.4 10*3/uL (ref 4.0–10.5)

## 2011-03-02 LAB — BASIC METABOLIC PANEL
GFR calc Af Amer: >60 mL/min (ref >60)
GFR calc non Af Amer: 59 mL/min — ABNORMAL LOW (ref >60)
Glucose, Bld: 149 mg/dL — ABNORMAL HIGH (ref 70–99)
Potassium: 3.9 mEq/L (ref 3.5–5.1)
Sodium: 135 mEq/L (ref 135–145)

## 2011-03-02 LAB — BRAIN NATRIURETIC PEPTIDE: Pro B Natriuretic peptide (BNP): 221 pg/mL — ABNORMAL HIGH (ref 0.0–100.0)

## 2011-03-03 ENCOUNTER — Inpatient Hospital Stay (HOSPITAL_BASED_OUTPATIENT_CLINIC_OR_DEPARTMENT_OTHER)
Admission: RE | Admit: 2011-03-03 | Discharge: 2011-03-03 | Disposition: A | Discharge status: Home / Self Care | Payer: No Typology Code available for payment source | Source: Ambulatory Visit | Attending: Interventional Cardiology | Admitting: Interventional Cardiology

## 2011-03-03 DIAGNOSIS — I509 Heart failure, unspecified: Secondary | ICD-10-CM | POA: Insufficient documentation

## 2011-03-03 DIAGNOSIS — I2789 Other specified pulmonary heart diseases: Secondary | ICD-10-CM | POA: Insufficient documentation

## 2011-03-03 LAB — POCT I-STAT 3, ART BLOOD GAS (G3+)
O2 Saturation: 92 %
TCO2: 23 mmol/L (ref 0–100)
pCO2 arterial: 35 mmHg (ref 35.0–45.0)
pH, Arterial: 7.41 — ABNORMAL HIGH (ref 7.350–7.400)
pO2, Arterial: 62 mmHg — ABNORMAL LOW (ref 80.0–100.0)

## 2011-03-03 LAB — POCT I-STAT 3, VENOUS BLOOD GAS (G3P V)
Bicarbonate: 22.5 mEq/L (ref 20.0–24.0)
pH, Ven: 7.389 — ABNORMAL HIGH (ref 7.250–7.300)
pO2, Ven: 36 mmHg (ref 30.0–45.0)

## 2011-03-04 ENCOUNTER — Other Ambulatory Visit (HOSPITAL_COMMUNITY): Payer: No Typology Code available for payment source

## 2011-03-11 ENCOUNTER — Other Ambulatory Visit (HOSPITAL_COMMUNITY): Payer: No Typology Code available for payment source

## 2011-03-16 ENCOUNTER — Ambulatory Visit (HOSPITAL_COMMUNITY)
Admission: RE | Admit: 2011-03-16 | Payer: No Typology Code available for payment source | Source: Ambulatory Visit | Admitting: Obstetrics & Gynecology

## 2011-03-18 ENCOUNTER — Other Ambulatory Visit: Payer: Self-pay | Admitting: Interventional Cardiology

## 2011-03-18 DIAGNOSIS — R1031 Right lower quadrant pain: Secondary | ICD-10-CM

## 2011-03-21 ENCOUNTER — Other Ambulatory Visit: Payer: Self-pay | Admitting: Interventional Cardiology

## 2011-03-21 ENCOUNTER — Ambulatory Visit
Admission: RE | Admit: 2011-03-21 | Discharge: 2011-03-21 | Disposition: A | Discharge status: Home / Self Care | Payer: No Typology Code available for payment source | Source: Ambulatory Visit | Attending: Interventional Cardiology | Admitting: Interventional Cardiology

## 2011-03-21 ENCOUNTER — Ambulatory Visit: Admission: RE | Admit: 2011-03-21 | Payer: No Typology Code available for payment source | Source: Ambulatory Visit

## 2011-03-21 DIAGNOSIS — R1031 Right lower quadrant pain: Secondary | ICD-10-CM

## 2011-03-23 NOTE — Procedures (Signed)
[name], [name] NO.:  [id number]  MEDICAL RECORD NO.:  [id number]           PATIENT TYPE:  LOCATION:                                 FACILITY:  PHYSICIAN:  Corky Crafts, MDDATE OF BIRTH:  Jul 30, 1966  DATE OF PROCEDURE:  03/03/2011 DATE OF DISCHARGE:                           CARDIAC CATHETERIZATION   PRIMARY CARE PHYSICIAN:  Deirdre Peer. Polite, MD  PROCEDURES PERFORMED:  Right heart catheterization, left heart catheterization, coronary angiogram, abdominal aortogram, left ventriculogram.  OPERATOR:  Corky Crafts, MD  INDICATION:  Congestive heart failure.  PROCEDURE NOTE:  The risks and benefits of cardiac catheterization was planned with patient informed consent was obtained.  She was brought to the outpatient cath lab.  She was prepped and draped in usual sterile fashion.  Her right groin was infiltrated with 1% lidocaine.  A 7-French sheath was placed into the right common femoral vein using the modified Seldinger technique.  A 4-French sheath was placed into the right common femoral artery using the modified Seldinger technique.  A Swan-Ganz catheter was advanced to the pulmonary artery under fluoroscopic guidance.  Hemodynamic pressure monitoring was obtained.  Saturation results were obtained as well.  The catheter was pulled back under continuous hemodynamic pressure monitoring and tracings into the different chambers was obtained.  A JL-4 catheter was used to engage the ostium of the left main coronary artery under fluoroscopic guidance. Digital angiography was performed projections using hand injection of contrast.  Right coronary artery angiography was performed using a 3D RCA catheter in a similar fashion.  Pigtail catheter was advanced to the ascending aorta across the aortic valve under fluoroscopic guidance. Power injection contrast performed in the RAO projection, the left ventricle.  Catheter was pulled back under  continuous hemodynamic pressure monitoring.  Catheter was withdrawn to the abdominal aorta and power injection of contrast performed in the AP projection.  The sheaths were removed using manual compression.  FINDINGS:  Right heart.  The main pulmonary artery pressure is 49/25 with a mean pulmonary artery pressure of 36 mmHg.  Pulmonary capillary wedge pressure was 29/29 with a mean wedge of 26.  Right ventricular pressure of 49/11 with an RVEDP of 15.  Right atrial pressure 19/14 with a mean RA of 13.  Left ventricular pressure 154/19 with an LVEDP of 32 mmHg.  Aortic pressure 146/90 with a mean aortic pressure of 115 mmHg. Pulmonary artery sat 69%.  Aortic sat 92%.  Cardiac index calculated by the Fick equating was 4. Coronary angiography shows a long left main which appears widely patent. Left circumflex is a large vessel which has a small OM-1 and OM-2, both of which were widely patent.  There is a medium-sized ramus vessel which branches and appears widely patent. The left anterior descending is a large vessel which reaches the apex. It appears angiographically normal.  There is a medium-sized first diagonal which appears widely patent. The right coronary artery is a large dominant vessel which appears angiographically normal.  The PDA posterolateral artery are medium-sized and widely patent.  Posterolateral artery gives off a long atrial branch. The left ventriculogram  is somewhat difficult to interpret because of arrhythmia post PVC ejection fraction appears to be in the 40-45% range. Abdominal aortogram shows no abdominal aortic aneurysm.  There are bilateral single renal arteries, both of which are widely patent.  The SMA appears widely patent.  The aortoiliac bifurcation is patent.  IMPRESSION: 1. Mildly decreased left ventricular function.  It appears improved     since her prior study several months ago. 2. Pulmonary artery hypertension, moderate degree. 3. Moderately  elevated left ventricular end-diastolic pressure. 4. No abdominal aortic aneurysm.  No significant coronary artery     disease.  RECOMMENDATIONS:  Continue medical therapy.  We will continue to try to diurese her given her elevated wedge pressure and LVEDP.     Corky Crafts, MD     JSV/MEDQ  D:  03/03/2011  T:  03/04/2011  Job:  [id number]  Electronically Signed by Lance Muss MD on 03/23/2011 09:10:49 AM

## 2011-03-24 IMAGING — US US PELV - US TRANSVAGINAL
1 series · 14 of 25 positions shown · non-contrast
Comparison: None.

CLINICAL DATA: Menorrhagia



[Series 1: us pelv - us transvaginal · 0.26mm/px · 14 of 75 slices shown]
[im 1/75]
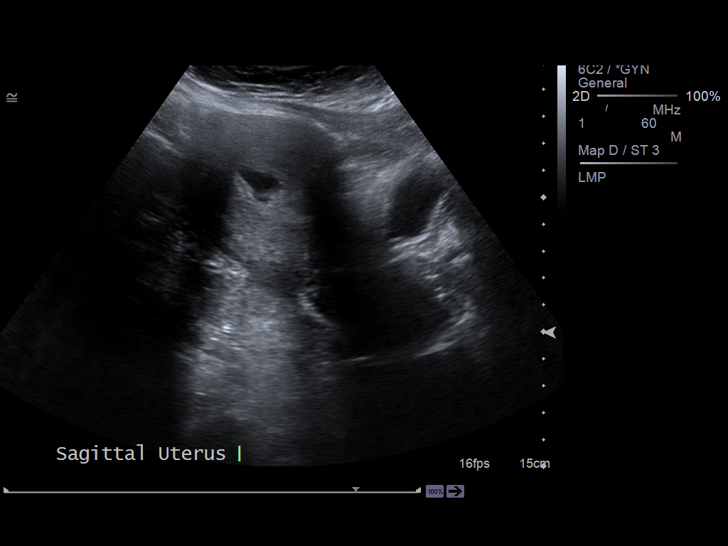
[im 7/75]
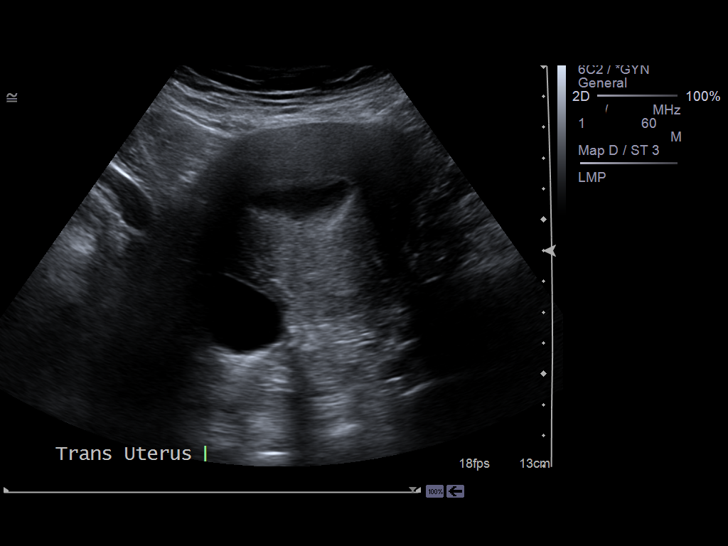
[im 13/75]
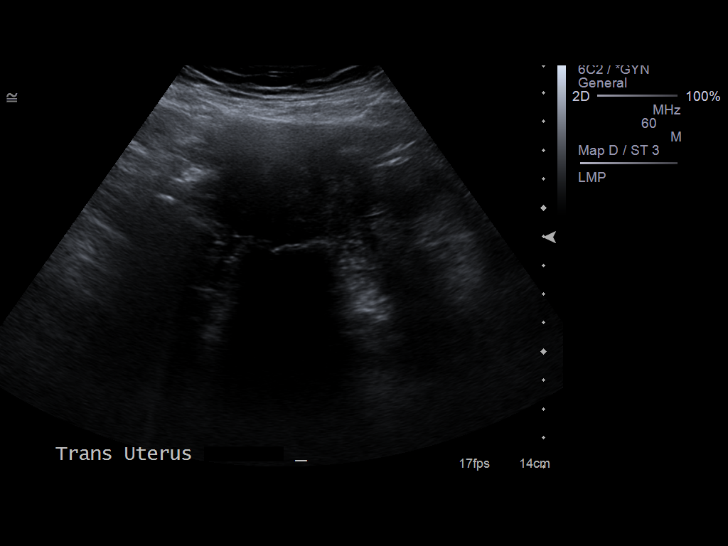
[im 19/75]
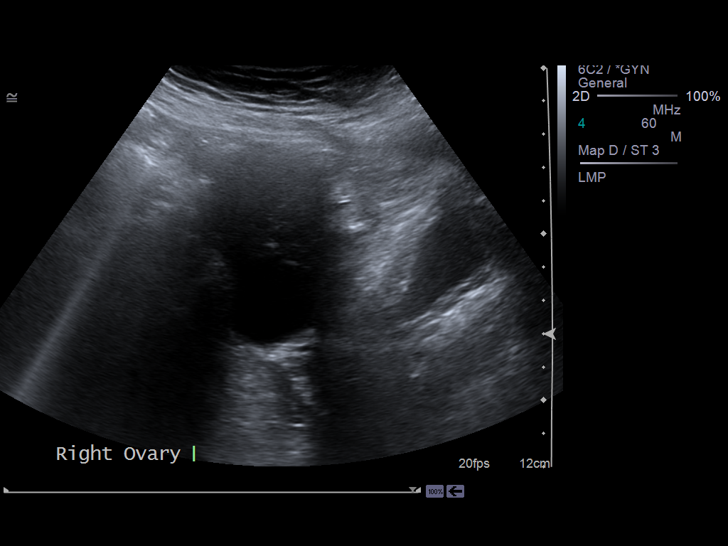
[im 25/75]
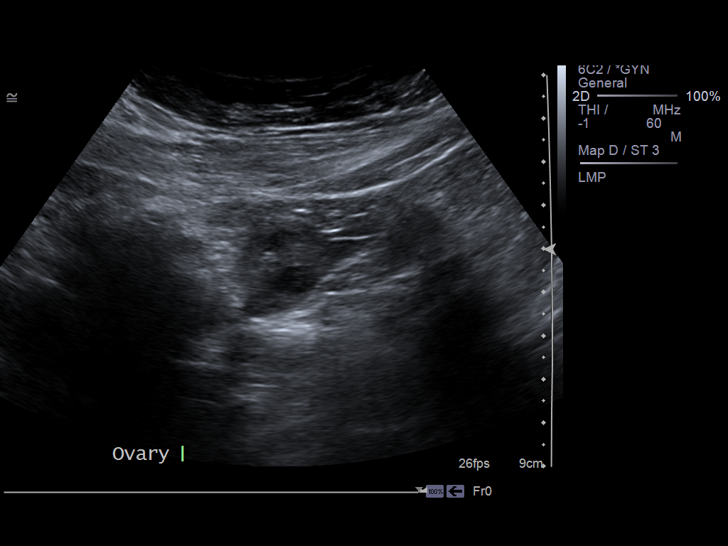
[im 28/75]
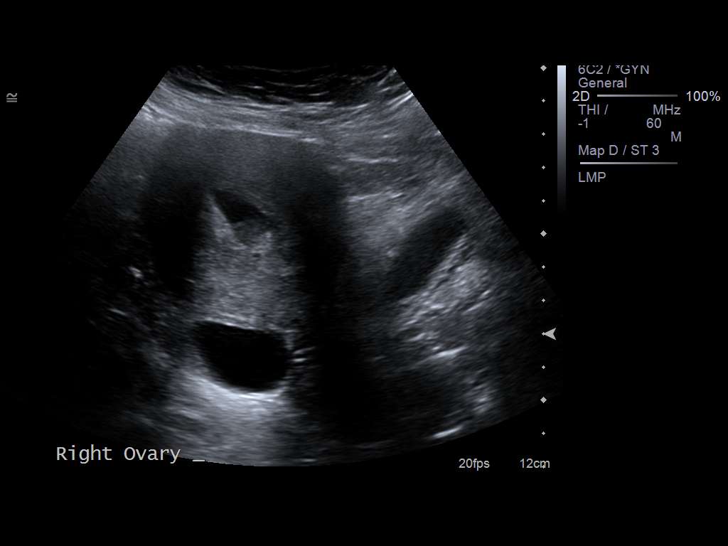
[im 34/75]
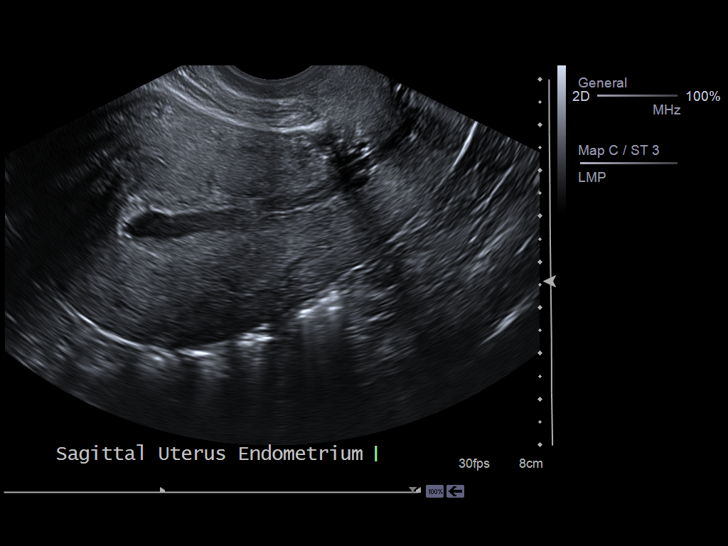
[im 41/75]
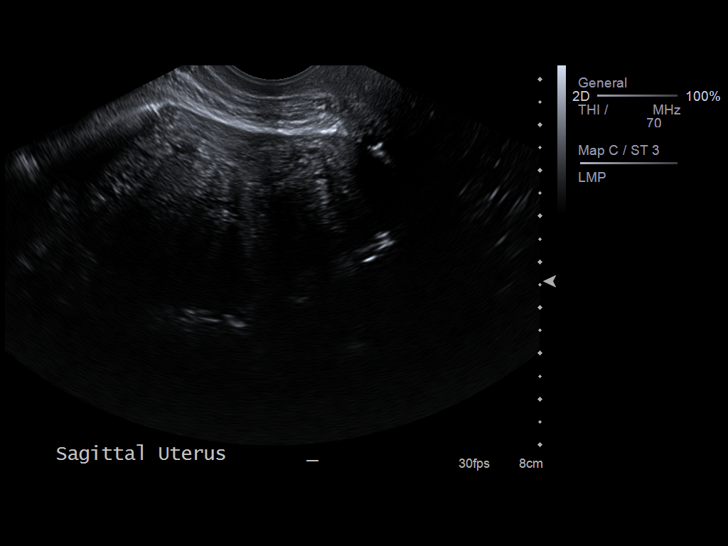
[im 47/75]
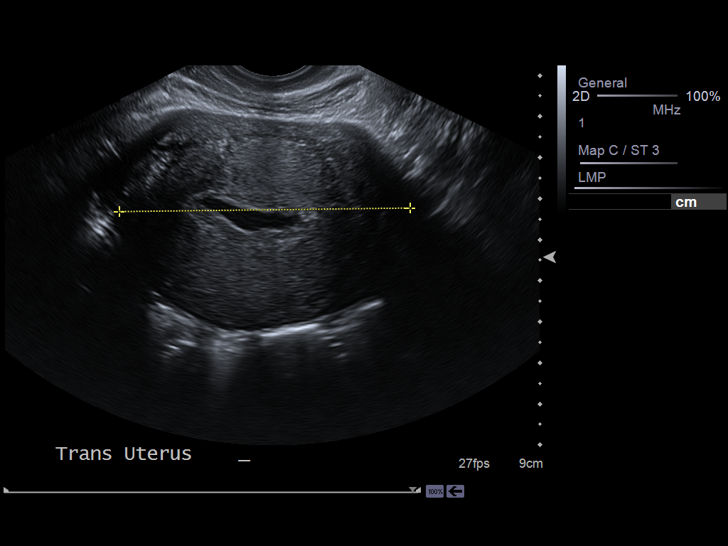
[im 50/75]
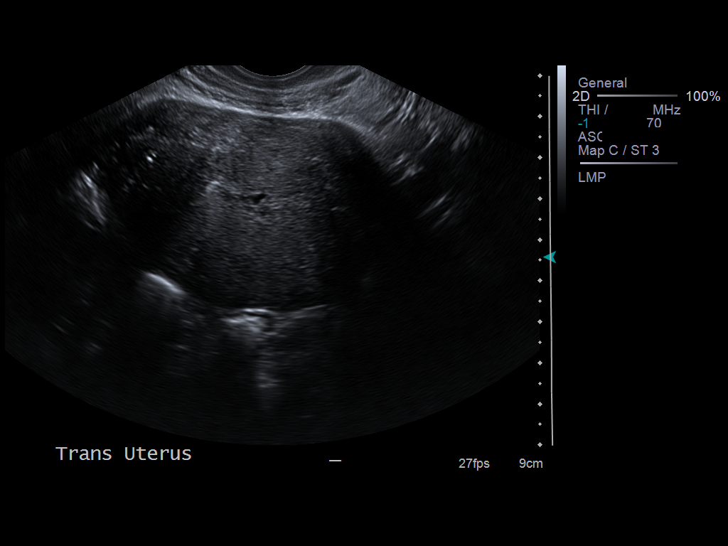
[im 56/75]
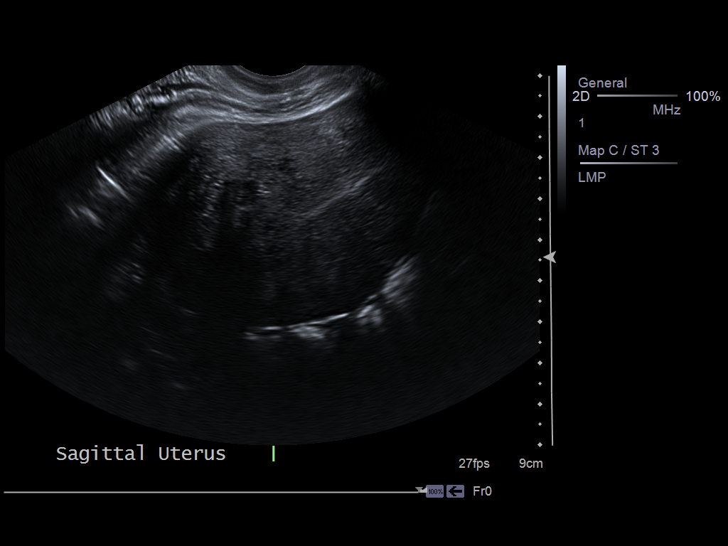
[im 62/75]
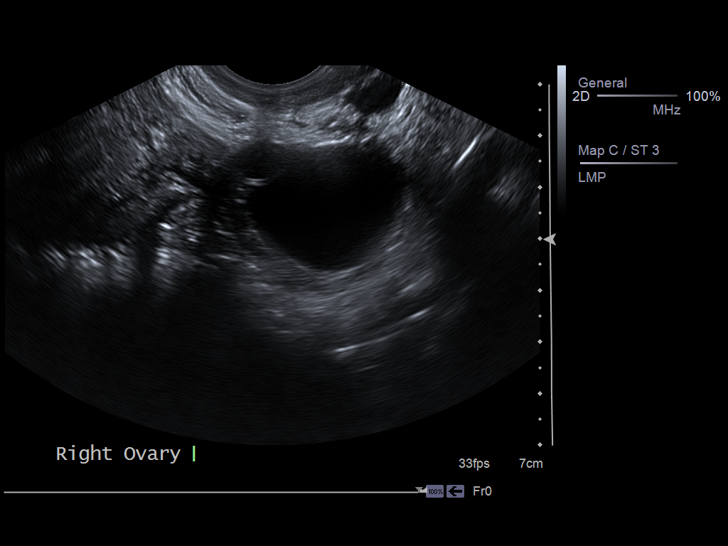
[im 68/75]
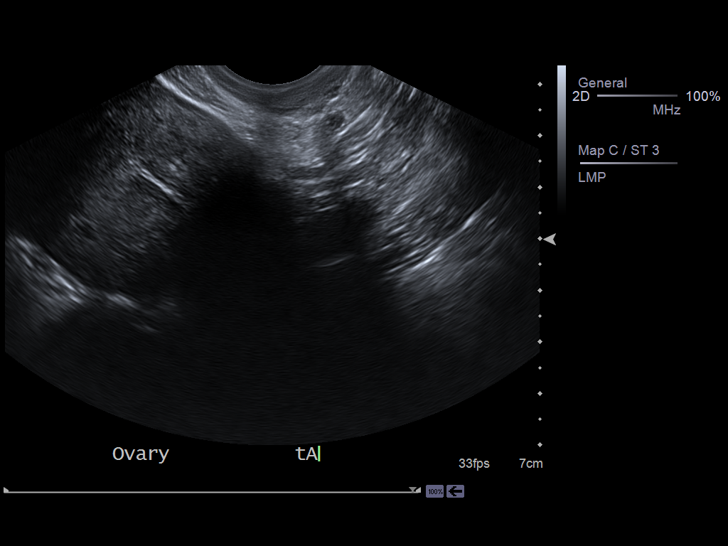
[im 75/75]
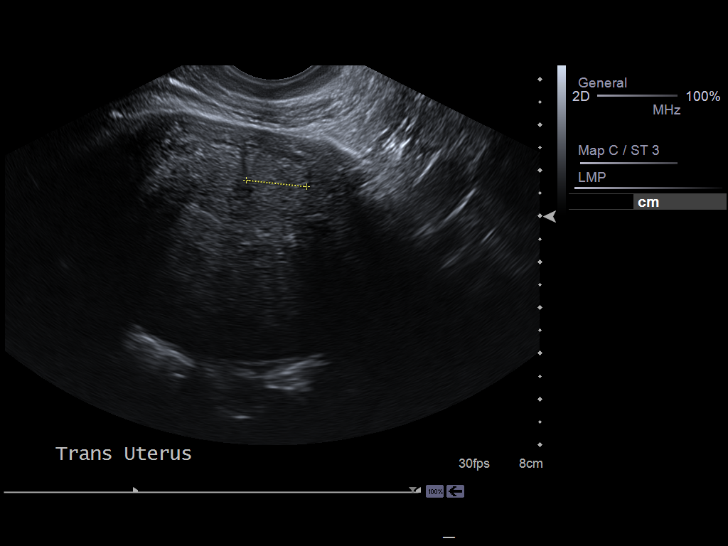

[14 of 25 positions shown; findings below may reference images not displayed]

FINDINGS: Uterus 9.9 x 5.4 x 7.1 cm.  There is prominence of the junctional
zone in the anterior fundic region.

Endometrium 1.7 cm in thickness.  Fluid is present within the
endometrial canal.

Right Ovary 3.4 x 3.2 x 3.7 cm.  2.8 cm simple cyst is contained in
the ovary.

Left Ovary is 2.0 x 2.5 x 2.3 cm and within normal limits.  It is
only seen by transabdominal imaging.

Other Findings:  No free fluid.
IMPRESSION: Prominent junctional zone in the uterus.  Adenomyosis is not
excluded.  A central fibroid can have a similar appearance.  MRI
can be performed to further characterize.

Endometrial fluid.

Benign-appearing cyst within the right ovary.

## 2011-07-06 ENCOUNTER — Other Ambulatory Visit: Payer: Self-pay | Admitting: Pediatrics

## 2011-07-18 ENCOUNTER — Emergency Department (HOSPITAL_COMMUNITY)
Admission: EM | Admit: 2011-07-18 | Discharge: 2011-07-19 | Disposition: A | Discharge status: Home / Self Care | Payer: 59 | Attending: Emergency Medicine | Admitting: Emergency Medicine

## 2011-07-18 ENCOUNTER — Encounter (HOSPITAL_COMMUNITY): Payer: Self-pay | Admitting: *Deleted

## 2011-07-18 ENCOUNTER — Emergency Department (HOSPITAL_COMMUNITY): Payer: 59

## 2011-07-18 DIAGNOSIS — Z79899 Other long term (current) drug therapy: Secondary | ICD-10-CM | POA: Insufficient documentation

## 2011-07-18 DIAGNOSIS — I509 Heart failure, unspecified: Secondary | ICD-10-CM | POA: Insufficient documentation

## 2011-07-18 DIAGNOSIS — I10 Essential (primary) hypertension: Secondary | ICD-10-CM | POA: Insufficient documentation

## 2011-07-18 DIAGNOSIS — X58XXXA Exposure to other specified factors, initial encounter: Secondary | ICD-10-CM | POA: Insufficient documentation

## 2011-07-18 DIAGNOSIS — M79609 Pain in unspecified limb: Secondary | ICD-10-CM | POA: Insufficient documentation

## 2011-07-18 DIAGNOSIS — S6990XA Unspecified injury of unspecified wrist, hand and finger(s), initial encounter: Secondary | ICD-10-CM | POA: Insufficient documentation

## 2011-07-18 HISTORY — DX: Heart failure, unspecified: I50.9

## 2011-07-18 HISTORY — DX: Essential (primary) hypertension: I10

## 2011-07-18 NOTE — ED Notes (Signed)
Pt reports increased pain and swelling to rt hand for the past week, pt denies any injury, PMS intact

## 2011-07-19 MED ORDER — HYDROCODONE-ACETAMINOPHEN 5-325 MG PO TABS
1.0000 | ORAL_TABLET | Freq: Once | ORAL | Status: AC
  Administered 2011-07-19: 1 via ORAL
  Filled 2011-07-19: qty 1

## 2011-07-19 MED ORDER — CELECOXIB 100 MG PO CAPS: 100.0000 mg | ORAL_CAPSULE | Freq: Two times a day (BID) | ORAL | Status: AC

## 2011-07-19 NOTE — ED Provider Notes (Signed)
History     Chief Complaint  Patient presents with  . Hand Pain   Patient is a 45 y.o. female presenting with hand pain. 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  Hand Pain This is a new problem. The current episode started yesterday. The problem occurs constantly. The problem has not changed since onset.Pertinent negatives include no chest pain, no abdominal pain and no headaches. The symptoms are aggravated by bending and twisting. The symptoms are relieved by nothing. She has tried nothing for the symptoms. The treatment provided no relief.    Past Medical History  Diagnosis Date  . CHF (congestive heart failure)   . Hypertension     History reviewed. No pertinent past surgical history.  No family history on file.  History  Substance Use Topics  . Smoking status: Never Smoker   . Smokeless tobacco: Not on file  . Alcohol Use: No    OB History    Grav Para Term Preterm Abortions TAB SAB Ect Mult Living                  Review of Systems  Constitutional: Negative for fatigue.  HENT: Negative for congestion, sinus pressure and ear discharge.   Eyes: Negative for discharge.  Respiratory: Negative for cough.   Cardiovascular: Negative for chest pain.  Gastrointestinal: Negative for abdominal pain and diarrhea.  Genitourinary: Negative for frequency and hematuria.  Musculoskeletal: Negative for back pain.       Pain in right hand  Skin: Negative for rash.  Neurological: Negative for seizures and headaches.  Hematological: Negative.   Psychiatric/Behavioral: Negative for hallucinations.    Physical Exam  BP 188/104  Pulse 84  Temp(Src) 99.1 F (37.3 C) (Oral)  Resp 20  Ht 5\' 2"  (1.575 m)  Wt 319 lb 8 oz (144.924 kg)  BMI 58.44 kg/m2  SpO2 100%  Physical Exam    Constitutional: She is oriented to person, place, and time. She appears well-developed.  HENT:  Head: Normocephalic and atraumatic.  Eyes: Conjunctivae and EOM are normal. No scleral icterus.  Neck: Neck supple. No tracheal deviation present. No thyromegaly present.  Cardiovascular: Normal rate and regular rhythm.  Exam reveals no gallop and no friction rub.   No murmur heard. Pulmonary/Chest: No stridor. She has no wheezes. She has no rales. She exhibits no tenderness.  Abdominal: She exhibits no distension. There is no tenderness. There is no rebound.  Musculoskeletal: Normal range of motion. She exhibits edema.  Lymphadenopathy:    She has no cervical adenopathy.  Neurological: She is oriented to person, place, and time. Coordination normal.       Patient has swelling to right thumb pain with flexion and extension neurovascular exam is normal  Skin: Skin is warm. No rash noted. No erythema.  Psychiatric: She has a normal mood and affect. Her behavior is normal.    ED Course  Procedures  MDM Tendonitis right thumb.   Results for orders placed during the hospital encounter of 03/03/11  POCT I-STAT 3, BLOOD GAS (G3P V)      Component Value Range   pH, Ven 7.389 (*) 7.250 - 7.300    pCO2, Ven 37.2 (*) 45.0 - 50.0 (mmHg)   pO2, Ven 36.0  30.0 - 45.0 (mmHg)  Bicarbonate 22.5  20.0 - 24.0 (mEq/L)   TCO2 24  0 - 100 (mmol/L)   O2 Saturation 69.0     Acid-base deficit 2.0  0.0 - 2.0 (mmol/L)   Sample type VENOUS     Comment VALUES VERIFIED BY LAB OR ISTAT    POCT I-STAT 3, BLOOD GAS (G3+)      Component Value Range   pH, Arterial 7.410 (*) 7.350 - 7.400    pCO2 35.0  35.0 - 45.0 (mmHg)   pO2, Arterial 62.0 (*) 80.0 - 100.0 (mmHg)   Bicarbonate 22.2  20.0 - 24.0 (mEq/L)   TCO2 23  0 - 100 (mmol/L)   O2 Saturation 92.0     Acid-base deficit 2.0  0.0 - 2.0 (mmol/L)   Sample type ARTERIAL     Dg Hand Complete Right  07/18/2011  *RADIOLOGY REPORT*  Clinical Data: Pain and  swelling in the thumb and metacarpals for 6 days.  RIGHT HAND - COMPLETE 3+ VIEW  Comparison: None.  Findings: The soft tissues appear generally prominent.  No foreign body or soft tissue emphysema is demonstrated.  There is no evidence of acute fracture, dislocation or bone destruction.  IMPRESSION: Nonspecific soft tissue swelling/prominence.  No acute osseous findings.  Original Report Authenticated By: Gerrianne Scale, M.D.          Benny Lennert, MD 07/19/11 [phone]

## 2011-09-29 LAB — DIFFERENTIAL
Basophils Absolute: 0.1
Basophils Relative: 1
Eosinophils Absolute: 0.2
Eosinophils Relative: 3
Monocytes Absolute: 0.6
Monocytes Relative: 8
Neutro Abs: 4.3

## 2011-09-29 LAB — CBC
Hemoglobin: 10.3 — ABNORMAL LOW
MCHC: 33
MCV: 77.3 — ABNORMAL LOW
RBC: 4.02
RDW: 17.6 — ABNORMAL HIGH

## 2011-09-29 LAB — BASIC METABOLIC PANEL
CO2: 28
Calcium: 8.7
Chloride: 102
GFR calc Af Amer: >60
Glucose, Bld: 129 — ABNORMAL HIGH
Sodium: 135

## 2011-09-29 LAB — POCT CARDIAC MARKERS
CKMB, poc: <1 — ABNORMAL LOW
Myoglobin, poc: 63.4
Troponin i, poc: <0.05

## 2011-12-20 DIAGNOSIS — J45909 Unspecified asthma, uncomplicated: Secondary | ICD-10-CM

## 2011-12-20 HISTORY — DX: Unspecified asthma, uncomplicated: J45.909

## 2012-03-07 DIAGNOSIS — J40 Bronchitis, not specified as acute or chronic: Secondary | ICD-10-CM | POA: Insufficient documentation

## 2012-03-07 DIAGNOSIS — Z79899 Other long term (current) drug therapy: Secondary | ICD-10-CM | POA: Insufficient documentation

## 2012-03-07 DIAGNOSIS — I10 Essential (primary) hypertension: Secondary | ICD-10-CM | POA: Insufficient documentation

## 2012-03-07 DIAGNOSIS — E669 Obesity, unspecified: Secondary | ICD-10-CM | POA: Insufficient documentation

## 2012-03-07 DIAGNOSIS — Z7982 Long term (current) use of aspirin: Secondary | ICD-10-CM | POA: Insufficient documentation

## 2012-03-07 DIAGNOSIS — I509 Heart failure, unspecified: Secondary | ICD-10-CM | POA: Insufficient documentation

## 2012-03-08 ENCOUNTER — Emergency Department (HOSPITAL_COMMUNITY)
Admission: EM | Admit: 2012-03-08 | Discharge: 2012-03-08 | Disposition: A | Discharge status: Home / Self Care | Payer: BC Managed Care – PPO | Attending: Emergency Medicine | Admitting: Emergency Medicine

## 2012-03-08 ENCOUNTER — Emergency Department (HOSPITAL_COMMUNITY): Payer: BC Managed Care – PPO

## 2012-03-08 ENCOUNTER — Encounter (HOSPITAL_COMMUNITY): Payer: Self-pay | Admitting: Emergency Medicine

## 2012-03-08 DIAGNOSIS — R05 Cough: Secondary | ICD-10-CM

## 2012-03-08 DIAGNOSIS — J40 Bronchitis, not specified as acute or chronic: Secondary | ICD-10-CM

## 2012-03-08 HISTORY — DX: Obesity, unspecified: E66.9

## 2012-03-08 MED ORDER — ALBUTEROL SULFATE HFA 108 (90 BASE) MCG/ACT IN AERS
2.0000 | INHALATION_SPRAY | RESPIRATORY_TRACT | Status: DC | PRN
  Filled 2012-03-08: qty 6.7

## 2012-03-08 MED ORDER — AEROCHAMBER PLUS W/MASK MISC
Status: AC
  Administered 2012-03-08: 06:00:00
  Filled 2012-03-08: qty 1

## 2012-03-08 MED ORDER — BENZONATATE 100 MG PO CAPS: 100.0000 mg | ORAL_CAPSULE | Freq: Three times a day (TID) | ORAL | Status: AC

## 2012-03-08 NOTE — ED Notes (Signed)
Patient presents to ed c/o cough onset several days ago and yest got worse, states sputum is clear.

## 2012-03-08 NOTE — Discharge Instructions (Signed)
Bronchitis Bronchitis is the body's way of reacting to injury and/or infection (inflammation) of the bronchi. Bronchi are the air tubes that extend from the windpipe into the lungs. If the inflammation becomes severe, it may cause shortness of breath. CAUSES  Inflammation may be caused by:  A virus.   Germs (bacteria).   Dust.   Allergens.   Pollutants and many other irritants.  The cells lining the bronchial tree are covered with tiny hairs (cilia). These constantly beat upward, away from the lungs, toward the mouth. This keeps the lungs free of pollutants. When these cells become too irritated and are unable to do their job, mucus begins to develop. This causes the characteristic cough of bronchitis. The cough clears the lungs when the cilia are unable to do their job. Without either of these protective mechanisms, the mucus would settle in the lungs. Then you would develop pneumonia. Smoking is a common cause of bronchitis and can contribute to pneumonia. Stopping this habit is the single most important thing you can do to help yourself. TREATMENT   Your caregiver may prescribe an antibiotic if the cough is caused by bacteria. Also, medicines that open up your airways make it easier to breathe. Your caregiver may also recommend or prescribe an expectorant. It will loosen the mucus to be coughed up. Only take over-the-counter or prescription medicines for pain, discomfort, or fever as directed by your caregiver.   Removing whatever causes the problem (smoking, for example) is critical to preventing the problem from getting worse.   Cough suppressants may be prescribed for relief of cough symptoms.   Inhaled medicines may be prescribed to help with symptoms now and to help prevent problems from returning.   For those with recurrent (chronic) bronchitis, there may be a need for steroid medicines.  SEEK IMMEDIATE MEDICAL CARE IF:   During treatment, you develop more pus-like mucus  (purulent sputum).   You have a fever.   Your baby is older than 3 months with a rectal temperature of 102 F (38.9 C) or higher.   Your baby is 96 months old or younger with a rectal temperature of 100.4 F (38 C) or higher.   You become progressively more ill.   You have increased difficulty breathing, wheezing, or shortness of breath.  It is necessary to seek immediate medical care if you are elderly or sick from any other disease. MAKE SURE YOU:   Understand these instructions.   Will watch your condition.   Will get help right away if you are not doing well or get worse.  Document Released: 12/05/2005 Document Revised: 11/24/2011 Document Reviewed: 10/14/2008 [facility] Patient Information 2012 [place], [place].  Cough, Adult  A cough is a reflex that helps clear your throat and airways. It can help heal the body or may be a reaction to an irritated airway. A cough may only last 2 or 3 weeks (acute) or may last more than 8 weeks (chronic).  CAUSES Acute cough:  Viral or bacterial infections.  Chronic cough:  Infections.   Allergies.   Asthma.   Post-nasal drip.   Smoking.   Heartburn or acid reflux.   Some medicines.   Chronic lung problems (COPD).   Cancer.  SYMPTOMS   Cough.   Fever.   Chest pain.   Increased breathing rate.   High-pitched whistling sound when breathing (wheezing).   Colored mucus that you cough up (sputum).  TREATMENT   A bacterial cough may be treated with antibiotic medicine.  A viral cough must run its course and will not respond to antibiotics.   Your caregiver may recommend other treatments if you have a chronic cough.  HOME CARE INSTRUCTIONS   Only take over-the-counter or prescription medicines for pain, discomfort, or fever as directed by your caregiver. Use cough suppressants only as directed by your caregiver.   Use a cold steam vaporizer or humidifier in your bedroom or home to help loosen secretions.   Sleep  in a semi-upright position if your cough is worse at night.   Rest as needed.   Stop smoking if you smoke.  SEEK IMMEDIATE MEDICAL CARE IF:   You have pus in your sputum.   Your cough starts to worsen.   You cannot control your cough with suppressants and are losing sleep.   You begin coughing up blood.   You have difficulty breathing.   You develop pain which is getting worse or is uncontrolled with medicine.   You have a fever.  MAKE SURE YOU:   Understand these instructions.   Will watch your condition.   Will get help right away if you are not doing well or get worse.  Document Released: 06/03/2011 Document Revised: 11/24/2011 Document Reviewed: 06/03/2011 [facility] Patient Information 2012 [place], [place].

## 2012-03-08 NOTE — ED Notes (Signed)
PT. REPORTS PRODUCTIVE COUGH WITH NASAL CONGESTION FOR 2 DAYS.

## 2012-03-08 NOTE — ED Provider Notes (Signed)
History     CSN: [id number]  Arrival date & time 03/07/12  2358   First MD Initiated Contact with Patient 03/08/12 [phone]      Chief Complaint  Patient presents with  . Cough    (Consider location/radiation/quality/duration/timing/severity/associated sxs/prior treatment) Patient is a 46 y.o. female presenting with cough. The history is provided by the patient. No language interpreter was used.  Cough This is a new problem. The current episode started 2 days ago. The problem occurs constantly. The problem has been gradually worsening. The cough is productive of sputum. There has been no fever. Associated symptoms include shortness of breath. Pertinent negatives include no chest pain, no chills, no ear congestion, no ear pain, no headaches, no rhinorrhea, no sore throat and no myalgias. She has tried decongestants for the symptoms. The treatment provided no relief. She is not a smoker.    Past Medical History  Diagnosis Date  . CHF (congestive heart failure)   . Hypertension   . Obesity     History reviewed. No pertinent past surgical history.  No family history on file.  History  Substance Use Topics  . Smoking status: Never Smoker   . Smokeless tobacco: Not on file  . Alcohol Use: No    OB History    Grav Para Term Preterm Abortions TAB SAB Ect Mult Living                  Review of Systems  Constitutional: Negative for fever, chills, activity change, appetite change and fatigue.  HENT: Positive for congestion. Negative for ear pain, sore throat, rhinorrhea, neck pain and neck stiffness.   Respiratory: Positive for cough and shortness of breath.   Cardiovascular: Negative for chest pain and palpitations.  Gastrointestinal: Negative for nausea, vomiting and abdominal pain.  Genitourinary: Negative for dysuria, urgency, frequency and flank pain.  Musculoskeletal: Negative for myalgias, back pain and arthralgias.  Neurological: Negative for dizziness, weakness,  light-headedness, numbness and headaches.  All other systems reviewed and are negative.    Allergies  Review of patient's allergies indicates no known allergies.  Home Medications   Current Outpatient Rx  Name Route Sig Dispense Refill  . ASPIRIN 81 MG PO CHEW Oral Chew 81 mg by mouth daily.      Marland Kitchen CARVEDILOL 6.25 MG PO TABS Oral Take 6.25 mg by mouth daily.    Marland Kitchen FERROUS SULFATE 325 (65 FE) MG PO TABS Oral Take 650 mg by mouth daily with breakfast.     . FUROSEMIDE 40 MG PO TABS Oral Take 40-80 mg by mouth 2 (two) times daily. 80 mg in the morning and 40 mg in the evening    . LOSARTAN POTASSIUM 50 MG PO TABS Oral Take 50 mg by mouth daily.      . MEGESTROL ACETATE 40 MG PO TABS Oral Take 40 mg by mouth daily.      Marland Kitchen BENZONATATE 100 MG PO CAPS Oral Take 1 capsule (100 mg total) by mouth every 8 (eight) hours. 21 capsule 0    BP 141/96  Temp(Src) 98.2 F (36.8 C) (Oral)  Resp 20  SpO2 96%  LMP 02/28/2012  Physical Exam  Nursing note and vitals reviewed. Constitutional: She is oriented to person, place, and time. She appears well-developed and well-nourished. No distress.  HENT:  Head: Normocephalic and atraumatic.  Mouth/Throat: Oropharynx is clear and moist.  Eyes: Conjunctivae and EOM are normal. Pupils are equal, round, and reactive to light.  Neck: Normal range  of motion. Neck supple.  Cardiovascular: Normal rate, regular rhythm, normal heart sounds and intact distal pulses.  Exam reveals no gallop and no friction rub.   No murmur heard. Pulmonary/Chest: Effort normal and breath sounds normal. No respiratory distress. She exhibits no tenderness.  Abdominal: Soft. Bowel sounds are normal. There is no tenderness.  Musculoskeletal: Normal range of motion. She exhibits no edema and no tenderness.  Neurological: She is alert and oriented to person, place, and time.  Skin: Skin is warm and dry. No rash noted.    ED Course  Procedures (including critical care time)  Labs  Reviewed - No data to display Dg Chest 2 View  03/08/2012  *RADIOLOGY REPORT*  Clinical Data: Cough  CHEST - 2 VIEW  Comparison: 01/05/2011  Findings: The heart size and mediastinal contours are within normal limits.  Both lungs are clear.  The visualized skeletal structures are unremarkable.  IMPRESSION: Negative exam.  Original Report Authenticated By: Rosealee Albee, M.D.     1. Cough   2. Bronchitis       MDM  Chest x-ray is negative. This is likely secondary to bronchitis or upper respiratory infection. She is provided with tessalon perles and an albuterol inhaler. Instructed to purchase guaifenesin over-the-counter. Has been taking Coricidin without relief. Vital signs are unremarkable. Will be discharged home with instructions to followup with her primary care physician        Dayton Bailiff, MD 03/08/12 [phone]

## 2012-06-22 ENCOUNTER — Emergency Department (HOSPITAL_COMMUNITY): Payer: BC Managed Care – PPO

## 2012-06-22 ENCOUNTER — Emergency Department (HOSPITAL_COMMUNITY)
Admission: EM | Admit: 2012-06-22 | Discharge: 2012-06-22 | Disposition: A | Discharge status: Home / Self Care | Payer: BC Managed Care – PPO | Attending: Emergency Medicine | Admitting: Emergency Medicine

## 2012-06-22 ENCOUNTER — Encounter (HOSPITAL_COMMUNITY): Payer: Self-pay | Admitting: *Deleted

## 2012-06-22 DIAGNOSIS — R059 Cough, unspecified: Secondary | ICD-10-CM | POA: Insufficient documentation

## 2012-06-22 DIAGNOSIS — J40 Bronchitis, not specified as acute or chronic: Secondary | ICD-10-CM | POA: Insufficient documentation

## 2012-06-22 DIAGNOSIS — J343 Hypertrophy of nasal turbinates: Secondary | ICD-10-CM | POA: Insufficient documentation

## 2012-06-22 DIAGNOSIS — Z79899 Other long term (current) drug therapy: Secondary | ICD-10-CM | POA: Insufficient documentation

## 2012-06-22 DIAGNOSIS — I10 Essential (primary) hypertension: Secondary | ICD-10-CM | POA: Insufficient documentation

## 2012-06-22 DIAGNOSIS — R05 Cough: Secondary | ICD-10-CM

## 2012-06-22 DIAGNOSIS — Z7982 Long term (current) use of aspirin: Secondary | ICD-10-CM | POA: Insufficient documentation

## 2012-06-22 DIAGNOSIS — I509 Heart failure, unspecified: Secondary | ICD-10-CM | POA: Insufficient documentation

## 2012-06-22 DIAGNOSIS — J3489 Other specified disorders of nose and nasal sinuses: Secondary | ICD-10-CM | POA: Insufficient documentation

## 2012-06-22 LAB — BASIC METABOLIC PANEL
Chloride: 98 mEq/L (ref 96–112)
GFR calc Af Amer: 61 mL/min — ABNORMAL LOW (ref >90)
GFR calc non Af Amer: 53 mL/min — ABNORMAL LOW (ref >90)
Glucose, Bld: 104 mg/dL — ABNORMAL HIGH (ref 70–99)
Potassium: 2.9 mEq/L — ABNORMAL LOW (ref 3.5–5.1)
Sodium: 139 mEq/L (ref 135–145)

## 2012-06-22 LAB — URINALYSIS, ROUTINE W REFLEX MICROSCOPIC
Glucose, UA: NEGATIVE mg/dL
Leukocytes, UA: NEGATIVE
Specific Gravity, Urine: 1.025 (ref 1.005–1.030)
pH: 6 (ref 5.0–8.0)

## 2012-06-22 LAB — CBC WITH DIFFERENTIAL/PLATELET
HCT: 39.3 % (ref 36.0–46.0)
Hemoglobin: 12.9 g/dL (ref 12.0–15.0)
Lymphs Abs: 1.5 10*3/uL (ref 0.7–4.0)
Monocytes Absolute: 0.6 10*3/uL (ref 0.1–1.0)
Monocytes Relative: 8 % (ref 3–12)
Neutro Abs: 5 10*3/uL (ref 1.7–7.7)
Neutrophils Relative %: 68 % (ref 43–77)
RBC: 4.29 MIL/uL (ref 3.87–5.11)

## 2012-06-22 LAB — URINE MICROSCOPIC-ADD ON

## 2012-06-22 LAB — PREGNANCY, URINE: Preg Test, Ur: NEGATIVE

## 2012-06-22 MED ORDER — BENZONATATE 100 MG PO CAPS: 100.0000 mg | ORAL_CAPSULE | Freq: Three times a day (TID) | ORAL | Status: AC | PRN

## 2012-06-22 MED ORDER — IPRATROPIUM BROMIDE 0.02 % IN SOLN
0.5000 mg | Freq: Once | RESPIRATORY_TRACT | Status: AC
  Administered 2012-06-22: 0.5 mg via RESPIRATORY_TRACT
  Filled 2012-06-22: qty 2.5

## 2012-06-22 MED ORDER — POTASSIUM CHLORIDE 20 MEQ/15ML (10%) PO LIQD
40.0000 meq | Freq: Once | ORAL | Status: AC
  Administered 2012-06-22: 40 meq via ORAL
  Filled 2012-06-22: qty 30

## 2012-06-22 MED ORDER — POTASSIUM CHLORIDE 10 MEQ/100ML IV SOLN
10.0000 meq | Freq: Once | INTRAVENOUS | Status: AC
  Administered 2012-06-22: 10 meq via INTRAVENOUS
  Filled 2012-06-22: qty 100

## 2012-06-22 MED ORDER — ALBUTEROL SULFATE (5 MG/ML) 0.5% IN NEBU
5.0000 mg | INHALATION_SOLUTION | Freq: Once | RESPIRATORY_TRACT | Status: AC
  Administered 2012-06-22: 5 mg via RESPIRATORY_TRACT
  Filled 2012-06-22: qty 1

## 2012-06-22 MED ORDER — ALBUTEROL SULFATE HFA 108 (90 BASE) MCG/ACT IN AERS
2.0000 | INHALATION_SPRAY | RESPIRATORY_TRACT | Status: AC
  Administered 2012-06-22: 2 via RESPIRATORY_TRACT
  Filled 2012-06-22: qty 6.7

## 2012-06-22 NOTE — ED Notes (Signed)
Iv potassium slowed to 25 cc/hr due to c/o burning

## 2012-06-22 NOTE — ED Notes (Signed)
Still has hacking non productive cough

## 2012-06-22 NOTE — ED Notes (Signed)
Pt stable at discharge.  

## 2012-06-22 NOTE — ED Notes (Signed)
Productive cough with fluid retention x 3 days.  Denies sob.

## 2012-06-22 NOTE — ED Provider Notes (Signed)
History     CSN: [id number]  Arrival date & time 06/22/12  1445   First MD Initiated Contact with Patient 06/22/12 1505      Chief Complaint  Patient presents with  . Cough  . Congestive Heart Failure     HPI Pt was seen at 1515.  Per pt, c/o gradual onset and persistence of constant "cough" for the past 3 days.  Describes the cough as "I get like this when my CHF flairs up."  States she has been taking her lasix as previously prescribed.  Denies CP/SOB, no abd pain, no N/V/D, no back pain, no fevers, no rash.    Past Medical History  Diagnosis Date  . CHF (congestive heart failure)   . Hypertension   . Obesity     History reviewed. No pertinent past surgical history.   History  Substance Use Topics  . Smoking status: Never Smoker   . Smokeless tobacco: Not on file  . Alcohol Use: No    Review of Systems ROS: Statement: All systems negative except as marked or noted in the HPI; Constitutional: Negative for fever and chills. ; ; Eyes: Negative for eye pain, redness and discharge. ; ; ENMT: Negative for ear pain, hoarseness, nasal congestion, sinus pressure and sore throat. ; ; Cardiovascular: Negative for chest pain, palpitations, diaphoresis, dyspnea and peripheral edema. ; ; Respiratory: +cough. Negative for wheezing and stridor. ; ; Gastrointestinal: Negative for nausea, vomiting, diarrhea, abdominal pain, blood in stool, hematemesis, jaundice and rectal bleeding. . ; ; Genitourinary: Negative for dysuria, flank pain and hematuria. ; ; Musculoskeletal: Negative for back pain and neck pain. Negative for swelling and trauma.; ; Skin: Negative for pruritus, rash, abrasions, blisters, bruising and skin lesion.; ; Neuro: Negative for headache, lightheadedness and neck stiffness. Negative for weakness, altered level of consciousness , altered mental status, extremity weakness, paresthesias, involuntary movement, seizure and syncope.      Allergies  Review of patient's allergies  indicates no known allergies.  Home Medications   Current Outpatient Rx  Name Route Sig Dispense Refill  . ASPIRIN 81 MG PO CHEW Oral Chew 81 mg by mouth daily.      Marland Kitchen CARVEDILOL 6.25 MG PO TABS Oral Take 6.25 mg by mouth daily.    Marland Kitchen FERROUS SULFATE 325 (65 FE) MG PO TABS Oral Take 650 mg by mouth daily with breakfast.     . FUROSEMIDE 40 MG PO TABS Oral Take 40-80 mg by mouth 2 (two) times daily. 80 mg in the morning and 40 mg in the evening    . LOSARTAN POTASSIUM 50 MG PO TABS Oral Take 50 mg by mouth daily.      . MEGESTROL ACETATE 40 MG PO TABS Oral Take 40 mg by mouth daily.        BP 140/83  Pulse 102  Temp 98.3 F (36.8 C) (Oral)  Resp 22  Ht 5\' 2"  (1.575 m)  Wt 320 lb (145.151 kg)  BMI 58.53 kg/m2  SpO2 98%  Physical Exam 1520: Physical examination:  Nursing notes reviewed; Vital signs and O2 SAT reviewed;  Constitutional: Well developed, Well nourished, Well hydrated, In no acute distress; Head:  Normocephalic, atraumatic; Eyes: EOMI, PERRL, No scleral icterus; ENMT: TM's clear bilat. +edemetous nasal turbinates bilat with clear rhinorrhea. Mouth and pharynx normal, Mucous membranes moist; Neck: Supple, Full range of motion, No lymphadenopathy; Cardiovascular: Regular rate and rhythm, No gallop; Respiratory: Breath sounds clear & equal bilaterally, No wheezes.  Speaking full  sentences with ease, Normal respiratory effort/excursion; Chest: Nontender, Movement normal; Abdomen: Soft, Nontender, Nondistended, Normal bowel sounds; Genitourinary: No CVA tenderness; Extremities: Pulses normal, No tenderness, +tr pedal edema bilat without calf asymmetry.; Neuro: AA&Ox3, Major CN grossly intact.  Speech clear. No gross focal motor or sensory deficits in extremities.; Skin: Color normal, Warm, Dry, no rash.   ED Course  Procedures   03/03/2011 Cardiac Cath: IMPRESSION:  1. Mildly decreased left ventricular function. It appears improved since her prior study several months ago.  2.  Pulmonary artery hypertension, moderate degree.  3. Moderately elevated left ventricular end-diastolic pressure.  4. No abdominal aortic aneurysm. No significant coronary artery disease.  Corky Crafts, MD    MDM  MDM Reviewed: nursing note, vitals and previous chart Reviewed previous: labs and ECG Interpretation: labs, ECG and x-ray      Date: 06/22/2012  Rate: 91  Rhythm: normal sinus rhythm  QRS Axis: left  Intervals: normal  ST/T Wave abnormalities: nonspecific T wave changes  Conduction Disutrbances:none  Narrative Interpretation:   Old EKG Reviewed: unchanged; no significant changes from previous EKG dated 02/08/2011.   Results for orders placed during the hospital encounter of 06/22/12  BASIC METABOLIC PANEL      Component Value Range   Sodium 139  135 - 145 mEq/L   Potassium 2.9 (*) 3.5 - 5.1 mEq/L   Chloride 98  96 - 112 mEq/L   CO2 32  19 - 32 mEq/L   Glucose, Bld 104 (*) 70 - 99 mg/dL   BUN 15  6 - 23 mg/dL   Creatinine, Ser 5.78 (*) 0.50 - 1.10 mg/dL   Calcium 9.7  8.4 - 46.9 mg/dL   GFR calc non Af Amer 53 (*) >90 mL/min   GFR calc Af Amer 61 (*) >90 mL/min  CBC WITH DIFFERENTIAL      Component Value Range   WBC 7.4  4.0 - 10.5 K/uL   RBC 4.29  3.87 - 5.11 MIL/uL   Hemoglobin 12.9  12.0 - 15.0 g/dL   HCT 62.9  52.8 - 41.3 %   MCV 91.6  78.0 - 100.0 fL   MCH 30.1  26.0 - 34.0 pg   MCHC 32.8  30.0 - 36.0 g/dL   RDW 24.4  01.0 - 27.2 %   Platelets 283  150 - 400 K/uL   Neutrophils Relative 68  43 - 77 %   Neutro Abs 5.0  1.7 - 7.7 K/uL   Lymphocytes Relative 21  12 - 46 %   Lymphs Abs 1.5  0.7 - 4.0 K/uL   Monocytes Relative 8  3 - 12 %   Monocytes Absolute 0.6  0.1 - 1.0 K/uL   Eosinophils Relative 3  0 - 5 %   Eosinophils Absolute 0.2  0.0 - 0.7 K/uL   Basophils Relative 0  0 - 1 %   Basophils Absolute 0.0  0.0 - 0.1 K/uL  PRO B NATRIURETIC PEPTIDE      Component Value Range   Pro B Natriuretic peptide (BNP) 17.8  0 - 125 pg/mL    URINALYSIS, ROUTINE W REFLEX MICROSCOPIC      Component Value Range   Color, Urine YELLOW  YELLOW   APPearance CLEAR  CLEAR   Specific Gravity, Urine 1.025  1.005 - 1.030   pH 6.0  5.0 - 8.0   Glucose, UA NEGATIVE  NEGATIVE mg/dL   Hgb urine dipstick LARGE (*) NEGATIVE   Bilirubin Urine NEGATIVE  NEGATIVE  Ketones, ur NEGATIVE  NEGATIVE mg/dL   Protein, ur NEGATIVE  NEGATIVE mg/dL   Urobilinogen, UA 0.2  0.0 - 1.0 mg/dL   Nitrite NEGATIVE  NEGATIVE   Leukocytes, UA NEGATIVE  NEGATIVE  TROPONIN I      Component Value Range   Troponin I <0.30  <0.30 ng/mL  PREGNANCY, URINE      Component Value Range   Preg Test, Ur NEGATIVE  NEGATIVE  URINE MICROSCOPIC-ADD ON      Component Value Range   Squamous Epithelial / LPF MANY (*) RARE   WBC, UA 3-6  <3 WBC/hpf   RBC / HPF 3-6  <3 RBC/hpf   Bacteria, UA MANY (*) RARE   Dg Chest 2 View 06/22/2012  *RADIOLOGY REPORT*  Clinical Data: Short of breath, cough  CHEST - 2 VIEW  Comparison: Chest x-ray of 03/08/2012  Findings: No active infiltrate or effusion is seen.  There is some peribronchial thickening which may indicate bronchitis.  The heart is within upper limits of normal.  No bony abnormality is seen.  IMPRESSION:  No pneumonia.  Peribronchial thickening may indicate bronchitis  Original Report Authenticated By: Juline Patch, M.D.    Results for YVAINE, JANKOWIAK (MRN [id number]) as of 06/22/2012 19:32  Ref. Range 01/06/2011 06:06 01/07/2011 06:15 01/09/2011 04:29 02/07/2011 12:18 02/09/2011 05:11 06/22/2012 15:29  BUN Latest Range: 6-23 mg/dL 4 (L) 8 10 12 18 15   Creat Latest Range: 0.50-1.10 mg/dL 9.56 2.13 0.86 5.78 (H) 1.42 (H) 1.20 (H)      7:33 PM:  States she feels "better" after neb.  NAD, resps easy, Sats 97% R/A.  Continues to deny CP or SOB.  UA appears contaminated.  Cr per baseline.  Hypokalemic today, repleted IV and PO.  Pt now reveals that she has already seen her doctor this past week and was made aware her potassium was low, rx  "potassium pills."  No signs of pneumonia or CHF on workup today.  Wants to go home now.  Dx testing d/w pt and family.  Questions answered.  Verb understanding, agreeable to d/c home with outpt f/u.   The patient appears reasonably screened and/or stabilized for discharge and I doubt any other medical condition or other [facility] requiring further screening, evaluation, or treatment in the ED at this time prior to discharge.       Laray Anger, DO 06/25/12 1324

## 2012-06-23 LAB — URINE CULTURE: Colony Count: 15000

## 2012-10-22 ENCOUNTER — Encounter (HOSPITAL_COMMUNITY): Payer: Self-pay | Admitting: Emergency Medicine

## 2012-10-22 ENCOUNTER — Emergency Department (HOSPITAL_COMMUNITY)
Admission: EM | Admit: 2012-10-22 | Discharge: 2012-10-22 | Disposition: A | Discharge status: Home / Self Care | Payer: BC Managed Care – PPO | Attending: Emergency Medicine | Admitting: Emergency Medicine

## 2012-10-22 ENCOUNTER — Emergency Department (HOSPITAL_COMMUNITY): Payer: BC Managed Care – PPO

## 2012-10-22 DIAGNOSIS — Z7982 Long term (current) use of aspirin: Secondary | ICD-10-CM | POA: Insufficient documentation

## 2012-10-22 DIAGNOSIS — E669 Obesity, unspecified: Secondary | ICD-10-CM | POA: Insufficient documentation

## 2012-10-22 DIAGNOSIS — J40 Bronchitis, not specified as acute or chronic: Secondary | ICD-10-CM

## 2012-10-22 DIAGNOSIS — R0602 Shortness of breath: Secondary | ICD-10-CM | POA: Insufficient documentation

## 2012-10-22 DIAGNOSIS — Z79899 Other long term (current) drug therapy: Secondary | ICD-10-CM | POA: Insufficient documentation

## 2012-10-22 DIAGNOSIS — I10 Essential (primary) hypertension: Secondary | ICD-10-CM | POA: Insufficient documentation

## 2012-10-22 DIAGNOSIS — I509 Heart failure, unspecified: Secondary | ICD-10-CM | POA: Insufficient documentation

## 2012-10-22 DIAGNOSIS — J209 Acute bronchitis, unspecified: Secondary | ICD-10-CM | POA: Insufficient documentation

## 2012-10-22 DIAGNOSIS — R062 Wheezing: Secondary | ICD-10-CM | POA: Insufficient documentation

## 2012-10-22 LAB — PRO B NATRIURETIC PEPTIDE: Pro B Natriuretic peptide (BNP): 81.1 pg/mL (ref 0–125)

## 2012-10-22 LAB — BASIC METABOLIC PANEL
BUN: 6 mg/dL (ref 6–23)
GFR calc Af Amer: 86 mL/min — ABNORMAL LOW (ref >90)
GFR calc non Af Amer: 74 mL/min — ABNORMAL LOW (ref >90)
Potassium: 3.7 mEq/L (ref 3.5–5.1)
Sodium: 140 mEq/L (ref 135–145)

## 2012-10-22 MED ORDER — PROMETHAZINE-CODEINE 6.25-10 MG/5ML PO SYRP: 5.0000 mL | ORAL_SOLUTION | ORAL | Status: DC | PRN

## 2012-10-22 MED ORDER — IPRATROPIUM BROMIDE 0.02 % IN SOLN
0.5000 mg | Freq: Once | RESPIRATORY_TRACT | Status: AC
  Administered 2012-10-22: 0.5 mg via RESPIRATORY_TRACT
  Filled 2012-10-22: qty 2.5

## 2012-10-22 MED ORDER — ALBUTEROL SULFATE (5 MG/ML) 0.5% IN NEBU
2.5000 mg | INHALATION_SOLUTION | Freq: Once | RESPIRATORY_TRACT | Status: AC
  Administered 2012-10-22: 2.5 mg via RESPIRATORY_TRACT
  Filled 2012-10-22: qty 0.5

## 2012-10-22 MED ORDER — PREDNISONE 10 MG PO TABS: ORAL_TABLET | ORAL | Status: DC

## 2012-10-22 MED ORDER — HYDROCOD POLST-CHLORPHEN POLST 10-8 MG/5ML PO LQCR
5.0000 mL | Freq: Once | ORAL | Status: AC
  Administered 2012-10-22: 5 mL via ORAL
  Filled 2012-10-22: qty 5

## 2012-10-22 MED ORDER — IPRATROPIUM-ALBUTEROL 18-103 MCG/ACT IN AERO: 2.0000 | INHALATION_SPRAY | Freq: Four times a day (QID) | RESPIRATORY_TRACT | Status: DC | PRN

## 2012-10-22 NOTE — ED Provider Notes (Signed)
History     CSN: [id number]  Arrival date & time 10/22/12  4098   First MD Initiated Contact with Patient 10/22/12 [phone]      Chief Complaint  Patient presents with  . Cough    (Consider location/radiation/quality/duration/timing/severity/associated sxs/prior treatment) Patient is a 46 y.o. female presenting with cough. The history is provided by the patient.  Cough This is a new problem. The current episode started yesterday. The problem occurs hourly. The problem has not changed since onset.The cough is productive of sputum. There has been no fever. Associated symptoms include shortness of breath and wheezing. Pertinent negatives include no chest pain, no chills, no rhinorrhea and no sore throat. She has tried nothing for the symptoms. The treatment provided no relief. She is not a smoker. Her past medical history is significant for bronchitis. Her past medical history does not include pneumonia or asthma. Past medical history comments: kCHF.    Past Medical History  Diagnosis Date  . CHF (congestive heart failure)   . Hypertension   . Obesity     History reviewed. No pertinent past surgical history.  No family history on file.  History  Substance Use Topics  . Smoking status: Never Smoker   . Smokeless tobacco: Not on file  . Alcohol Use: No    OB History    Grav Para Term Preterm Abortions TAB SAB Ect Mult Living                  Review of Systems  Constitutional: Negative for chills and activity change.       All ROS Neg except as noted in HPI  HENT: Negative for nosebleeds, sore throat, rhinorrhea and neck pain.   Eyes: Negative for photophobia and discharge.  Respiratory: Positive for cough, shortness of breath and wheezing.   Cardiovascular: Negative for chest pain and palpitations.  Gastrointestinal: Negative for abdominal pain and blood in stool.  Genitourinary: Negative for dysuria, frequency and hematuria.  Musculoskeletal: Negative for back pain and  arthralgias.  Skin: Negative.   Neurological: Negative for dizziness, seizures and speech difficulty.  Psychiatric/Behavioral: Negative for hallucinations and confusion.    Allergies  Review of patient's allergies indicates no known allergies.  Home Medications   Current Outpatient Rx  Name  Route  Sig  Dispense  Refill  . ASPIRIN 81 MG PO CHEW   Oral   Chew 81 mg by mouth 2 (two) times daily.          Marland Kitchen CARVEDILOL 6.25 MG PO TABS   Oral   Take 6.25 mg by mouth 2 (two) times daily.          Marland Kitchen FERROUS SULFATE 325 (65 FE) MG PO TABS   Oral   Take 650 mg by mouth daily with breakfast.          . FUROSEMIDE 40 MG PO TABS   Oral   Take 40 mg by mouth daily.          Marland Kitchen METFORMIN HCL ER 500 MG PO TB24   Oral   Take 500 mg by mouth every evening.         Marland Kitchen METOLAZONE 5 MG PO TABS   Oral   Take 5 mg by mouth as directed. *Take one tablet by mouth every day 30 minutes before Lasix (Furosemide) for 2 to 3 days as needed for fluid.*         . POTASSIUM CHLORIDE CRYS ER 20 MEQ PO TBCR  Oral   Take 20 mEq by mouth daily. *Take one tablet by  Mouth only on days that Lasix (Furosemide) is taken         . SPIRONOLACTONE 50 MG PO TABS   Oral   Take 50 mg by mouth daily.         . TELMISARTAN 40 MG PO TABS   Oral   Take 40 mg by mouth daily.           BP 166/101  Pulse 74  Temp 98.5 F (36.9 C)  Resp 18  Ht 5\' 3"  (1.6 m)  Wt 314 lb (142.429 kg)  BMI 55.62 kg/m2  SpO2 97%  Physical Exam  Nursing note and vitals reviewed. Constitutional: She is oriented to person, place, and time. She appears well-developed and well-nourished.  Non-toxic appearance.       Frequent cough during examination.  HENT:  Head: Normocephalic.  Right Ear: Tympanic membrane and external ear normal.  Left Ear: Tympanic membrane and external ear normal.  Eyes: EOM and lids are normal. Pupils are equal, round, and reactive to light.  Neck: Normal range of motion. Neck supple.  Carotid bruit is not present.  Cardiovascular: Normal rate, regular rhythm, normal heart sounds, intact distal pulses and normal pulses.   Pulmonary/Chest: Breath sounds normal. No respiratory distress.       Course breath sounds. No focal consolidation.  Abdominal: Soft. Bowel sounds are normal. There is no tenderness. There is no guarding.  Musculoskeletal: Normal range of motion.       No hot or swollen areas. No pitting edema.  Lymphadenopathy:       Head (right side): No submandibular adenopathy present.       Head (left side): No submandibular adenopathy present.    She has no cervical adenopathy.  Neurological: She is alert and oriented to person, place, and time. She has normal strength. No cranial nerve deficit or sensory deficit.  Skin: Skin is warm and dry.  Psychiatric: She has a normal mood and affect. Her speech is normal.    ED Course  Procedures (including critical care time)  Labs Reviewed - No data to display No results found.  Pulse oximetry 97% on room air. Within normal limits by my interpretation. No diagnosis found.    MDM  I have reviewed nursing notes, vital signs, and all appropriate lab and imaging results for this patient. The chest x-ray is read as negative. The basic metabolic panel shows a glucose of 101 otherwise within normal limits. B natruretic peptide is within normal limits. The patient is breathing better and coughing less after medications here in the emergency department. Prescription for albuterol inhaler 2 puffs every 4 hours, prednisone taper (5 day), promethazine cough medication every 6 hours as needed. Patient see her primary physician for recheck of cough and b/p.       Kathie Dike, [place] 10/22/12 [phone]

## 2012-10-22 NOTE — ED Provider Notes (Signed)
Medical screening examination/treatment/procedure(s) were performed by non-physician practitioner and as supervising physician I was immediately available for consultation/collaboration.   Ralph Brouwer L Shavell Nored, MD 10/22/12 1514 

## 2012-10-22 NOTE — ED Notes (Signed)
Pt c/o chest congestion/productive cough with clear sputum since yesterday.

## 2013-02-02 ENCOUNTER — Emergency Department (HOSPITAL_COMMUNITY)
Admission: EM | Admit: 2013-02-02 | Discharge: 2013-02-03 | Disposition: A | Discharge status: Home / Self Care | Payer: BC Managed Care – PPO | Attending: Emergency Medicine | Admitting: Emergency Medicine

## 2013-02-02 ENCOUNTER — Encounter (HOSPITAL_COMMUNITY): Payer: Self-pay | Admitting: *Deleted

## 2013-02-02 DIAGNOSIS — I10 Essential (primary) hypertension: Secondary | ICD-10-CM | POA: Insufficient documentation

## 2013-02-02 DIAGNOSIS — E876 Hypokalemia: Secondary | ICD-10-CM | POA: Insufficient documentation

## 2013-02-02 DIAGNOSIS — M4722 Other spondylosis with radiculopathy, cervical region: Secondary | ICD-10-CM

## 2013-02-02 DIAGNOSIS — I509 Heart failure, unspecified: Secondary | ICD-10-CM | POA: Insufficient documentation

## 2013-02-02 DIAGNOSIS — IMO0001 Reserved for inherently not codable concepts without codable children: Secondary | ICD-10-CM | POA: Insufficient documentation

## 2013-02-02 DIAGNOSIS — Z79899 Other long term (current) drug therapy: Secondary | ICD-10-CM | POA: Insufficient documentation

## 2013-02-02 DIAGNOSIS — E669 Obesity, unspecified: Secondary | ICD-10-CM | POA: Insufficient documentation

## 2013-02-02 DIAGNOSIS — M79609 Pain in unspecified limb: Secondary | ICD-10-CM | POA: Insufficient documentation

## 2013-02-02 DIAGNOSIS — M47812 Spondylosis without myelopathy or radiculopathy, cervical region: Secondary | ICD-10-CM | POA: Insufficient documentation

## 2013-02-02 NOTE — ED Notes (Signed)
Pt c/o pain radiating from right wrist to her neck x 1 day

## 2013-02-03 ENCOUNTER — Emergency Department (HOSPITAL_COMMUNITY): Payer: BC Managed Care – PPO

## 2013-02-03 LAB — BASIC METABOLIC PANEL
Chloride: 102 mEq/L (ref 96–112)
GFR calc Af Amer: 86 mL/min — ABNORMAL LOW (ref >90)
GFR calc non Af Amer: 74 mL/min — ABNORMAL LOW (ref >90)
Potassium: 3.2 mEq/L — ABNORMAL LOW (ref 3.5–5.1)
Sodium: 141 mEq/L (ref 135–145)

## 2013-02-03 MED ORDER — POTASSIUM CHLORIDE CRYS ER 20 MEQ PO TBCR
40.0000 meq | EXTENDED_RELEASE_TABLET | Freq: Once | ORAL | Status: AC
  Administered 2013-02-03: 40 meq via ORAL
  Filled 2013-02-03: qty 2

## 2013-02-03 MED ORDER — HYDROCODONE-ACETAMINOPHEN 5-325 MG PO TABS
2.0000 | ORAL_TABLET | Freq: Once | ORAL | Status: AC
  Administered 2013-02-03: 2 via ORAL
  Filled 2013-02-03: qty 2

## 2013-02-03 MED ORDER — ONDANSETRON HCL 4 MG PO TABS
4.0000 mg | ORAL_TABLET | Freq: Once | ORAL | Status: AC
  Administered 2013-02-03: 4 mg via ORAL
  Filled 2013-02-03: qty 1

## 2013-02-03 MED ORDER — HYDROCODONE-ACETAMINOPHEN 5-325 MG PO TABS: 2.0000 | ORAL_TABLET | ORAL | Status: DC | PRN

## 2013-02-03 NOTE — ED Provider Notes (Signed)
History     CSN: [id number]  Arrival date & time 02/02/13  2340   First MD Initiated Contact with Patient 02/02/13 2352      Chief Complaint  Patient presents with  . Arm Pain  . Neck Pain    (Consider location/radiation/quality/duration/timing/severity/associated sxs/prior treatment) Patient is a 47 y.o. female presenting with arm pain and neck pain. The history is provided by the patient.  Arm Pain This is a new problem. The current episode started today. The problem occurs constantly. The problem has been unchanged. Associated symptoms include myalgias and neck pain. Pertinent negatives include no abdominal pain, arthralgias, chest pain or coughing. Nothing aggravates the symptoms. She has tried nothing for the symptoms. The treatment provided no relief.  Neck Pain Associated symptoms: no chest pain and no photophobia     Past Medical History  Diagnosis Date  . CHF (congestive heart failure)   . Hypertension   . Obesity     History reviewed. No pertinent past surgical history.  History reviewed. No pertinent family history.  History  Substance Use Topics  . Smoking status: Never Smoker   . Smokeless tobacco: Not on file  . Alcohol Use: No    OB History   Grav Para Term Preterm Abortions TAB SAB Ect Mult Living                  Review of Systems  Constitutional: Negative for activity change.       All ROS Neg except as noted in HPI  HENT: Positive for neck pain. Negative for nosebleeds.   Eyes: Negative for photophobia and discharge.  Respiratory: Negative for cough, shortness of breath and wheezing.   Cardiovascular: Positive for leg swelling. Negative for chest pain and palpitations.  Gastrointestinal: Negative for abdominal pain and blood in stool.  Genitourinary: Negative for dysuria, frequency and hematuria.  Musculoskeletal: Positive for myalgias. Negative for back pain and arthralgias.  Skin: Negative.   Neurological: Negative for dizziness, seizures  and speech difficulty.  Psychiatric/Behavioral: Negative for hallucinations and confusion.    Allergies  Review of patient's allergies indicates no known allergies.  Home Medications   Current Outpatient Rx  Name  Route  Sig  Dispense  Refill  . albuterol-ipratropium (COMBIVENT) 18-103 MCG/ACT inhaler   Inhalation   Inhale 2 puffs into the lungs every 6 (six) hours as needed for shortness of breath.   1 Inhaler   0   . aspirin 81 MG chewable tablet   Oral   Chew 81 mg by mouth 2 (two) times daily.          . carvedilol (COREG) 6.25 MG tablet   Oral   Take 6.25 mg by mouth 2 (two) times daily.          . ferrous sulfate 325 (65 FE) MG tablet   Oral   Take 650 mg by mouth daily with breakfast.          . furosemide (LASIX) 40 MG tablet   Oral   Take 40-80 mg by mouth daily. 80 mg in the morning and 40 in the afternoon.         . medroxyPROGESTERone (DEPO-PROVERA) 150 MG/ML injection   Intramuscular   Inject 150 mg into the muscle every 3 (three) months.         . metolazone (ZAROXOLYN) 5 MG tablet   Oral   Take 5 mg by mouth as directed. *Take one tablet by mouth every day 30 minutes before  Lasix (Furosemide) for 2 to 3 days as needed for fluid.*         . potassium chloride SA (K-DUR,KLOR-CON) 20 MEQ tablet   Oral   Take 20 mEq by mouth daily. *Take one tablet by  Mouth only on days that Lasix (Furosemide) is taken         . spironolactone (ALDACTONE) 50 MG tablet   Oral   Take 50 mg by mouth daily.         Marland Kitchen telmisartan (MICARDIS) 40 MG tablet   Oral   Take 40 mg by mouth daily.           BP 167/119  Pulse 85  Temp(Src) 98.1 F (36.7 C) (Oral)  Resp 20  Ht 5\' 2"  (1.575 m)  Wt 313 lb 3 oz (142.061 kg)  BMI 57.27 kg/m2  SpO2 98%  Physical Exam  Nursing note and vitals reviewed. Constitutional: She is oriented to person, place, and time. She appears well-developed and well-nourished.  Non-toxic appearance.  HENT:  Head:  Normocephalic.  Right Ear: Tympanic membrane and external ear normal.  Left Ear: Tympanic membrane and external ear normal.  Eyes: EOM and lids are normal. Pupils are equal, round, and reactive to light.  Neck: Normal range of motion. Neck supple. Carotid bruit is not present.  Cardiovascular: Normal rate, regular rhythm, normal heart sounds, intact distal pulses and normal pulses.   Pulmonary/Chest: Breath sounds normal. No respiratory distress.  Abdominal: Soft. Bowel sounds are normal. There is no tenderness. There is no guarding.  Musculoskeletal: Normal range of motion.  Lymphadenopathy:       Head (right side): No submandibular adenopathy present.       Head (left side): No submandibular adenopathy present.    She has no cervical adenopathy.  Neurological: She is alert and oriented to person, place, and time. She has normal strength. No cranial nerve deficit or sensory deficit.  Skin: Skin is warm and dry.  Psychiatric: She has a normal mood and affect. Her speech is normal.    ED Course  Procedures (including critical care time)  Labs Reviewed - No data to display No results found.   No diagnosis found.    MDM  I have reviewed nursing notes, vital signs, and all appropriate lab and imaging results for this patient. Pt states she has hx of CHF. No c/o chest pain. No recorded weight gain. Pain for right wrist to neck has a "tingling effect" at times.  Bmet reveals a potassium of 3.2 o/W wnl.  c spine xray reveals multilevel disc space narowing and endplate spurring, especially at C5-6, and C6-7. Chest xray is negative for acute changes . Result given to the patient. Pt will follow up with primary MD . Information on high potassium foods and Rx for norco #15 tabs given to the patient.     Kathie Dike, [place] 02/03/13 [phone]

## 2013-02-04 NOTE — ED Provider Notes (Signed)
Medical screening examination/treatment/procedure(s) were performed by non-physician practitioner and as supervising physician I was immediately available for consultation/collaboration.  Nicoletta Dress. Colon Branch, MD 02/04/13 1610

## 2013-12-22 ENCOUNTER — Emergency Department (HOSPITAL_COMMUNITY): Payer: Managed Care, Other (non HMO)

## 2013-12-22 DIAGNOSIS — J069 Acute upper respiratory infection, unspecified: Secondary | ICD-10-CM | POA: Insufficient documentation

## 2013-12-22 DIAGNOSIS — J40 Bronchitis, not specified as acute or chronic: Secondary | ICD-10-CM | POA: Insufficient documentation

## 2013-12-22 DIAGNOSIS — R079 Chest pain, unspecified: Secondary | ICD-10-CM | POA: Insufficient documentation

## 2013-12-22 DIAGNOSIS — Z7982 Long term (current) use of aspirin: Secondary | ICD-10-CM | POA: Insufficient documentation

## 2013-12-22 DIAGNOSIS — E669 Obesity, unspecified: Secondary | ICD-10-CM | POA: Insufficient documentation

## 2013-12-22 DIAGNOSIS — Z79899 Other long term (current) drug therapy: Secondary | ICD-10-CM | POA: Insufficient documentation

## 2013-12-22 DIAGNOSIS — I509 Heart failure, unspecified: Secondary | ICD-10-CM | POA: Insufficient documentation

## 2013-12-22 DIAGNOSIS — I10 Essential (primary) hypertension: Secondary | ICD-10-CM | POA: Insufficient documentation

## 2013-12-23 ENCOUNTER — Encounter (HOSPITAL_COMMUNITY): Payer: Self-pay | Admitting: Emergency Medicine

## 2013-12-23 ENCOUNTER — Emergency Department (HOSPITAL_COMMUNITY)
Admission: EM | Admit: 2013-12-23 | Discharge: 2013-12-23 | Disposition: A | Discharge status: Home / Self Care | Payer: Managed Care, Other (non HMO) | Attending: Emergency Medicine | Admitting: Emergency Medicine

## 2013-12-23 DIAGNOSIS — J069 Acute upper respiratory infection, unspecified: Secondary | ICD-10-CM

## 2013-12-23 DIAGNOSIS — J40 Bronchitis, not specified as acute or chronic: Secondary | ICD-10-CM

## 2013-12-23 MED ORDER — PREDNISONE 10 MG PO TABS
ORAL_TABLET | ORAL | Status: AC
  Filled 2013-12-23: qty 1

## 2013-12-23 MED ORDER — HYDROCOD POLST-CHLORPHEN POLST 10-8 MG/5ML PO LQCR
5.0000 mL | Freq: Once | ORAL | Status: AC
Start: 2013-12-23 — End: 2013-12-23
  Administered 2013-12-23: 5 mL via ORAL

## 2013-12-23 MED ORDER — PREDNISONE 10 MG PO TABS: ORAL_TABLET | ORAL | Status: DC

## 2013-12-23 MED ORDER — PREDNISONE 50 MG PO TABS
ORAL_TABLET | ORAL | Status: AC
  Filled 2013-12-23: qty 1

## 2013-12-23 MED ORDER — PREDNISONE 50 MG PO TABS
60.0000 mg | ORAL_TABLET | Freq: Once | ORAL | Status: AC
  Administered 2013-12-23: 60 mg via ORAL

## 2013-12-23 MED ORDER — HYDROCOD POLST-CHLORPHEN POLST 10-8 MG/5ML PO LQCR
ORAL | Status: AC
  Filled 2013-12-23: qty 5

## 2013-12-23 MED ORDER — HYDROCOD POLST-CHLORPHEN POLST 10-8 MG/5ML PO LQCR: 5.0000 mL | Freq: Two times a day (BID) | ORAL | Status: DC

## 2013-12-23 NOTE — ED Notes (Signed)
Patient c/o coughing x 2 days; states most of the time is dry, hacking; but sometimes she does get up clear sputum.

## 2013-12-23 NOTE — Discharge Instructions (Signed)
Your chest x-ray is negative for pneumonia or congestive heart failure, or acute problem. Your oxygen level was within normal limits. Please use prednisone taper as prescribed. Please use Tussionex for cough. Tussionex may cause drowsiness, please use with caution. Use your albuterol nebulizer if wheezing or difficulty breathing occurs. Please see your primary physician, or return to the emergency department if any changes, problems, or concerns. Please check your weight daily over the next 7-10 days, and please report any pattern of increase to Dr. Renae Gloss. Bronchitis Bronchitis is a problem of the air tubes leading to your lungs. This problem makes it hard for air to get in and out of the lungs. You may cough a lot because your air tubes are narrow. Going without care can cause lasting (chronic) bronchitis. HOME CARE   Drink enough fluids to keep your pee (urine) clear or pale yellow.  Use a cool mist humidifier.  Quit smoking if you smoke. If you keep smoking, the bronchitis might not get better.  Only take medicine as told by your doctor. GET HELP RIGHT AWAY IF:   Coughing keeps you awake.  You start to wheeze.  You become more sick or weak.  You have a hard time breathing or get short of breath.  You cough up blood.  Coughing lasts more than 2 weeks.  You have a fever.  Your baby is older than 3 months with a rectal temperature of 102 F (38.9 C) or higher.  Your baby is 6 months old or younger with a rectal temperature of 100.4 F (38 C) or higher. MAKE SURE YOU:  Understand these instructions.  Will watch your condition.  Will get help right away if you are not doing well or get worse. Document Released: 05/23/2008 Document Revised: 02/27/2012 Document Reviewed: 07/30/2013 [facility] Patient Information 2014 [place], [place].

## 2013-12-23 NOTE — ED Provider Notes (Signed)
CSN: [id number]     Arrival date & time 12/22/13  2331 History   First MD Initiated Contact with Patient 12/23/13 0033     Chief Complaint  Patient presents with  . Cough   (Consider location/radiation/quality/duration/timing/severity/associated sxs/prior Treatment) Patient is a 48 y.o. female presenting with cough. The history is provided by the patient.  Cough Cough characteristics:  Non-productive and barking Severity:  Severe Onset quality:  Gradual Duration:  2 days Timing:  Intermittent Progression:  Worsening Chronicity:  New Smoker: no   Context: sick contacts, upper respiratory infection and weather changes   Relieved by:  Nothing Ineffective treatments: lasix. Associated symptoms: chest pain, rhinorrhea, shortness of breath and sinus congestion   Associated symptoms: no eye discharge, no sore throat and no wheezing     Past Medical History  Diagnosis Date  . CHF (congestive heart failure)   . Hypertension   . Obesity    History reviewed. No pertinent past surgical history. No family history on file. History  Substance Use Topics  . Smoking status: Never Smoker   . Smokeless tobacco: Not on file  . Alcohol Use: No   OB History   Grav Para Term Preterm Abortions TAB SAB Ect Mult Living                 Review of Systems  Constitutional: Negative for activity change.       All ROS Neg except as noted in HPI  HENT: Positive for rhinorrhea. Negative for nosebleeds and sore throat.   Eyes: Negative for photophobia and discharge.  Respiratory: Positive for cough and shortness of breath. Negative for wheezing.   Cardiovascular: Positive for chest pain. Negative for palpitations.  Gastrointestinal: Negative for abdominal pain and blood in stool.  Genitourinary: Negative for dysuria, frequency and hematuria.  Musculoskeletal: Negative for arthralgias, back pain and neck pain.  Skin: Negative.   Neurological: Negative for dizziness, seizures and speech difficulty.   Psychiatric/Behavioral: Negative for hallucinations and confusion.    Allergies  Review of patient's allergies indicates no known allergies.  Home Medications   Current Outpatient Rx  Name  Route  Sig  Dispense  Refill  . albuterol-ipratropium (COMBIVENT) 18-103 MCG/ACT inhaler   Inhalation   Inhale 2 puffs into the lungs every 6 (six) hours as needed for shortness of breath.   1 Inhaler   0   . aspirin 81 MG chewable tablet   Oral   Chew 81 mg by mouth 2 (two) times daily.          . carvedilol (COREG) 6.25 MG tablet   Oral   Take 6.25 mg by mouth 2 (two) times daily.          . ferrous sulfate 325 (65 FE) MG tablet   Oral   Take 650 mg by mouth daily with breakfast.          . furosemide (LASIX) 40 MG tablet   Oral   Take 40-80 mg by mouth daily. 80 mg in the morning and 40 in the afternoon.         Marland Kitchen HYDROcodone-acetaminophen (NORCO/VICODIN) 5-325 MG per tablet   Oral   Take 2 tablets by mouth every 4 (four) hours as needed for pain.   15 tablet   0   . potassium chloride SA (K-DUR,KLOR-CON) 20 MEQ tablet   Oral   Take 20 mEq by mouth daily. *Take one tablet by  Mouth only on days that Lasix (Furosemide) is taken         .  spironolactone (ALDACTONE) 50 MG tablet   Oral   Take 50 mg by mouth daily.         . medroxyPROGESTERone (DEPO-PROVERA) 150 MG/ML injection   Intramuscular   Inject 150 mg into the muscle every 3 (three) months.         . metolazone (ZAROXOLYN) 5 MG tablet   Oral   Take 5 mg by mouth as directed. *Take one tablet by mouth every day 30 minutes before Lasix (Furosemide) for 2 to 3 days as needed for fluid.*         . telmisartan (MICARDIS) 40 MG tablet   Oral   Take 40 mg by mouth daily.          BP 155/102  Pulse 74  Temp(Src) 98.3 F (36.8 C) (Oral)  Resp 18  Ht 5\' 3"  (1.6 m)  Wt 303 lb (137.44 kg)  BMI 53.69 kg/m2  SpO2 96% Physical Exam  Nursing note and vitals reviewed. Constitutional: She is oriented  to person, place, and time. She appears well-developed and well-nourished.  Non-toxic appearance.  HENT:  Head: Normocephalic.  Right Ear: Tympanic membrane and external ear normal.  Left Ear: Tympanic membrane and external ear normal.  Nasal congestion present.  Eyes: EOM and lids are normal. Pupils are equal, round, and reactive to light.  Neck: Normal range of motion. Neck supple. Carotid bruit is not present.  Cardiovascular: Normal rate, regular rhythm, normal heart sounds, intact distal pulses and normal pulses.   Pulmonary/Chest: No respiratory distress. She has no wheezes. She has rhonchi. She has no rales.  Abdominal: Soft. Bowel sounds are normal. There is no tenderness. There is no guarding.  Musculoskeletal: Normal range of motion.       Right upper leg: She exhibits no swelling and no edema.       Left upper leg: She exhibits no swelling and no edema.  Lymphadenopathy:       Head (right side): No submandibular adenopathy present.       Head (left side): No submandibular adenopathy present.    She has no cervical adenopathy.  Neurological: She is alert and oriented to person, place, and time. She has normal strength. No cranial nerve deficit or sensory deficit.  Skin: Skin is warm and dry.  Psychiatric: She has a normal mood and affect. Her speech is normal.    ED Course  Procedures (including critical care time) Labs Review Labs Reviewed - No data to display Imaging Review Dg Chest 2 View  12/23/2013   CLINICAL DATA:  Cough and weakness.  EXAM: CHEST  2 VIEW  COMPARISON:  Chest radiograph February 03, 2013  FINDINGS: Cardiomediastinal silhouette is unremarkable. The lungs are clear without pleural effusions or focal consolidations. Pulmonary vasculature is unremarkable. Trachea projects midline and there is no pneumothorax. Soft tissue planes and included osseous structures are nonsuspicious. Symmetrically widened subacromial joint spaces are likely projectional.  IMPRESSION:  No active cardiopulmonary disease.   Electronically Signed   By: Awilda Metro   On: 12/23/2013 00:51    EKG Interpretation   None       MDM  No diagnosis found. *I have reviewed nursing notes, vital signs, and all appropriate lab and imaging results for this patient.* Chest x-ray is negative for cardiopulmonary disease. Vital signs are stable with exception of the blood pressure being 155/102. Pulse oximetry is 96% on room air. Within normal limits by my interpretation.  The plan at this time is for the patient  to be placed on Tussionex and prednisone taper. Patient has albuterol nebulizer treatments at home. We have discussed the need to return if the cough is not responding to medications. Patient is also to notify her physician or return to the emergency department if she notices her weight increasing due to her history of congestive heart failure.  Kathie DikeHobson M Rosalin Buster, PA-C 12/23/13 0130

## 2013-12-23 NOTE — ED Provider Notes (Signed)
Medical screening examination/treatment/procedure(s) were conducted as a shared visit with non-physician practitioner(s) and myself.  I personally evaluated the patient during the encounter.  EKG Interpretation   None      Patient feels better after breathing treatment. No clinical evidence of congestive heart failure.  Donnetta Hutching, MD 12/23/13 [phone]

## 2014-04-02 LAB — CYTOLOGY - PAP

## 2014-04-11 ENCOUNTER — Emergency Department (HOSPITAL_COMMUNITY)
Admission: EM | Admit: 2014-04-11 | Discharge: 2014-04-11 | Disposition: A | Discharge status: Home / Self Care | Payer: 59 | Attending: Emergency Medicine | Admitting: Emergency Medicine

## 2014-04-11 ENCOUNTER — Encounter (HOSPITAL_COMMUNITY): Payer: Self-pay | Admitting: Emergency Medicine

## 2014-04-11 ENCOUNTER — Emergency Department (HOSPITAL_COMMUNITY): Payer: 59

## 2014-04-11 DIAGNOSIS — R5383 Other fatigue: Secondary | ICD-10-CM

## 2014-04-11 DIAGNOSIS — R5381 Other malaise: Secondary | ICD-10-CM | POA: Insufficient documentation

## 2014-04-11 DIAGNOSIS — R059 Cough, unspecified: Secondary | ICD-10-CM | POA: Insufficient documentation

## 2014-04-11 DIAGNOSIS — R200 Anesthesia of skin: Secondary | ICD-10-CM

## 2014-04-11 DIAGNOSIS — Z79899 Other long term (current) drug therapy: Secondary | ICD-10-CM | POA: Insufficient documentation

## 2014-04-11 DIAGNOSIS — T887XXA Unspecified adverse effect of drug or medicament, initial encounter: Secondary | ICD-10-CM

## 2014-04-11 DIAGNOSIS — R202 Paresthesia of skin: Secondary | ICD-10-CM

## 2014-04-11 DIAGNOSIS — R05 Cough: Secondary | ICD-10-CM | POA: Insufficient documentation

## 2014-04-11 DIAGNOSIS — M549 Dorsalgia, unspecified: Secondary | ICD-10-CM | POA: Insufficient documentation

## 2014-04-11 DIAGNOSIS — E669 Obesity, unspecified: Secondary | ICD-10-CM | POA: Insufficient documentation

## 2014-04-11 DIAGNOSIS — R209 Unspecified disturbances of skin sensation: Secondary | ICD-10-CM | POA: Insufficient documentation

## 2014-04-11 DIAGNOSIS — I10 Essential (primary) hypertension: Secondary | ICD-10-CM | POA: Insufficient documentation

## 2014-04-11 DIAGNOSIS — R071 Chest pain on breathing: Secondary | ICD-10-CM | POA: Insufficient documentation

## 2014-04-11 DIAGNOSIS — T4275XA Adverse effect of unspecified antiepileptic and sedative-hypnotic drugs, initial encounter: Secondary | ICD-10-CM | POA: Insufficient documentation

## 2014-04-11 DIAGNOSIS — Z7982 Long term (current) use of aspirin: Secondary | ICD-10-CM | POA: Insufficient documentation

## 2014-04-11 DIAGNOSIS — I509 Heart failure, unspecified: Secondary | ICD-10-CM | POA: Insufficient documentation

## 2014-04-11 LAB — CBC WITH DIFFERENTIAL/PLATELET
BASOS ABS: 0 10*3/uL (ref 0.0–0.1)
BASOS PCT: 0 % (ref 0–1)
Eosinophils Absolute: 0.1 10*3/uL (ref 0.0–0.7)
Eosinophils Relative: 2 % (ref 0–5)
HCT: 40.8 % (ref 36.0–46.0)
HEMOGLOBIN: 13.5 g/dL (ref 12.0–15.0)
Lymphocytes Relative: 24 % (ref 12–46)
Lymphs Abs: 1.7 10*3/uL (ref 0.7–4.0)
MCH: 30.1 pg (ref 26.0–34.0)
MCHC: 33.1 g/dL (ref 30.0–36.0)
MCV: 90.9 fL (ref 78.0–100.0)
MONOS PCT: 9 % (ref 3–12)
Monocytes Absolute: 0.6 10*3/uL (ref 0.1–1.0)
NEUTROS ABS: 4.6 10*3/uL (ref 1.7–7.7)
NEUTROS PCT: 65 % (ref 43–77)
Platelets: 271 10*3/uL (ref 150–400)
RBC: 4.49 MIL/uL (ref 3.87–5.11)
RDW: 13.6 % (ref 11.5–15.5)
WBC: 7 10*3/uL (ref 4.0–10.5)

## 2014-04-11 LAB — COMPREHENSIVE METABOLIC PANEL
ALK PHOS: 60 U/L (ref 39–117)
ALT: 8 U/L (ref 0–35)
AST: 11 U/L (ref 0–37)
Albumin: 3.4 g/dL — ABNORMAL LOW (ref 3.5–5.2)
BUN: 11 mg/dL (ref 6–23)
CALCIUM: 9.5 mg/dL (ref 8.4–10.5)
CO2: 28 mEq/L (ref 19–32)
CREATININE: 1 mg/dL (ref 0.50–1.10)
Chloride: 101 mEq/L (ref 96–112)
GFR calc non Af Amer: 65 mL/min — ABNORMAL LOW (ref >90)
GFR, EST AFRICAN AMERICAN: 75 mL/min — AB (ref >90)
GLUCOSE: 89 mg/dL (ref 70–99)
POTASSIUM: 3.8 meq/L (ref 3.7–5.3)
Sodium: 139 mEq/L (ref 137–147)
TOTAL PROTEIN: 7.7 g/dL (ref 6.0–8.3)
Total Bilirubin: 0.4 mg/dL (ref 0.3–1.2)

## 2014-04-11 LAB — MAGNESIUM: MAGNESIUM: 1.9 mg/dL (ref 1.5–2.5)

## 2014-04-11 NOTE — ED Notes (Signed)
Feels like pins and needles are sticking her in her arms and legs.

## 2014-04-11 NOTE — ED Provider Notes (Signed)
CSN: [id number]     Arrival date & time 04/11/14  1710 History   This chart was scribed for Ward Givens, MD by Dorothey Baseman, ED Scribe. This patient was seen in room APA08/[facility]08 and the patient's care was started at 7:00 PM.    Chief Complaint  Patient presents with  . Tingling   The history is provided by the patient. No language interpreter was used.   HPI Comments: [name] is a 48 y.o. female who presents to the Emergency Department complaining of intermittent paresthesias to the bilateral arms and legs onset about a week ago that has been progressively worsening. She states that the paresthesias have kept her from sleeping well with subsequent associated fatigue. Patient reports an associated pain to the upper back onset earlier today. Patient states that she started taking Topamax a month ago (prescribed on 03/11/2014) to help her lose weight and has subsequently lost weight, about 2 dress sizes per patient. Patient states that she developed a dry cough over the past few hours while in the ED with an associated pain to the chest wall secondary to the cough. She denies headache, neck pain, nausea, vomiting, diarrhea, dizziness, lightheadedness, abnormal urinary symptoms. Patient has a history of CHF, HTN, and obesity.   PCP Dr Olena Leatherwood in [place]  Past Medical History  Diagnosis Date  . CHF (congestive heart failure)   . Hypertension   . Obesity    History reviewed. No pertinent past surgical history. History reviewed. No pertinent family history. History  Substance Use Topics  . Smoking status: Never Smoker   . Smokeless tobacco: Not on file  . Alcohol Use: No   Lives at home Lives with spouse  OB History   Grav Para Term Preterm Abortions TAB SAB Ect Mult Living                 Review of Systems  Constitutional: Positive for fatigue.  Respiratory: Positive for cough.   Cardiovascular: Positive for chest pain.  Gastrointestinal: Negative for nausea, vomiting and  diarrhea.  Genitourinary: Negative for difficulty urinating.  Musculoskeletal: Positive for back pain. Negative for neck pain.  Neurological: Negative for dizziness, light-headedness and headaches.       Paresthesias   All other systems reviewed and are negative.     Allergies  Review of patient's allergies indicates no known allergies.  Home Medications   Prior to Admission medications   Medication Sig Start Date End Date Taking? Authorizing Provider  albuterol-ipratropium (COMBIVENT) 18-103 MCG/ACT inhaler Inhale 2 puffs into the lungs every 6 (six) hours as needed for shortness of breath. 10/22/12   Kathie Dike, PA-C  aspirin 81 MG chewable tablet Chew 81 mg by mouth 2 (two) times daily.     Historical Provider, MD  carvedilol (COREG) 6.25 MG tablet Take 6.25 mg by mouth 2 (two) times daily.     Historical Provider, MD  chlorpheniramine-HYDROcodone (TUSSIONEX PENNKINETIC ER) 10-8 MG/5ML LQCR Take 5 mLs by mouth 2 (two) times daily. 12/23/13   Kathie Dike, PA-C  ferrous sulfate 325 (65 FE) MG tablet Take 650 mg by mouth daily with breakfast.     Historical Provider, MD  furosemide (LASIX) 40 MG tablet Take 40-80 mg by mouth daily. 80 mg in the morning and 40 in the afternoon.    Historical Provider, MD  HYDROcodone-acetaminophen (NORCO/VICODIN) 5-325 MG per tablet Take 2 tablets by mouth every 4 (four) hours as needed for pain. 02/03/13   Lyla Son  Beverely Pace, PA-C  medroxyPROGESTERone (DEPO-PROVERA) 150 MG/ML injection Inject 150 mg into the muscle every 3 (three) months.    Historical Provider, MD  metolazone (ZAROXOLYN) 5 MG tablet Take 5 mg by mouth as directed. *Take one tablet by mouth every day 30 minutes before Lasix (Furosemide) for 2 to 3 days as needed for fluid.*    Historical Provider, MD  potassium chloride SA (K-DUR,KLOR-CON) 20 MEQ tablet Take 20 mEq by mouth daily. *Take one tablet by  Mouth only on days that Lasix (Furosemide) is taken    Historical Provider, MD   predniSONE (DELTASONE) 10 MG tablet 5,4,3,2,1 - take with food 12/23/13   Kathie Dike, PA-C  spironolactone (ALDACTONE) 50 MG tablet Take 50 mg by mouth daily.    Historical Provider, MD  telmisartan (MICARDIS) 40 MG tablet Take 40 mg by mouth daily.    Historical Provider, MD   Triage Vitals: BP 151/85  Pulse 92  Temp(Src) 98.2 F (36.8 C) (Oral)  Resp 20  Ht 5\' 2"  (1.575 m)  Wt 302 lb (136.986 kg)  BMI 55.22 kg/m2  SpO2 98%  LMP 03/05/2014  Vital signs normal except hypertension   Physical Exam  Nursing note and vitals reviewed. Constitutional: She is oriented to person, place, and time. She appears well-developed and well-nourished.  Non-toxic appearance. She does not appear ill. No distress.  HENT:  Head: Normocephalic and atraumatic.  Right Ear: External ear normal.  Left Ear: External ear normal.  Nose: Nose normal. No mucosal edema or rhinorrhea.  Mouth/Throat: Oropharynx is clear and moist and mucous membranes are normal. No dental abscesses or uvula swelling.  Eyes: Conjunctivae and EOM are normal. Pupils are equal, round, and reactive to light.  Neck: Normal range of motion and full passive range of motion without pain. Neck supple.  Cardiovascular: Normal rate, regular rhythm and normal heart sounds.  Exam reveals no gallop and no friction rub.   No murmur heard. Pulmonary/Chest: Effort normal and breath sounds normal. No respiratory distress. She has no wheezes. She has no rhonchi. She has no rales. She exhibits no tenderness and no crepitus.  Abdominal: Soft. Normal appearance and bowel sounds are normal. She exhibits no distension. There is no tenderness. There is no rebound and no guarding.  Musculoskeletal: Normal range of motion. She exhibits no edema and no tenderness.  Moves all extremities well.   Neurological: She is alert and oriented to person, place, and time. She has normal strength. No cranial nerve deficit.  No pronator drift. Equal grip strength. No  motor weakness.   Skin: Skin is warm, dry and intact. No rash noted. No erythema. No pallor.  Psychiatric: She has a normal mood and affect. Her speech is normal and behavior is normal. Her mood appears not anxious.    ED Course  Procedures (including critical care time)  DIAGNOSTIC STUDIES: Oxygen Saturation is 98% on room air, normal by my interpretation.    COORDINATION OF CARE: 7:06 PM- Discussed that symptoms are likely related to the patient's electrolytes such as her potassium, calcium, or magnesium levels with her being on diuretics, and will order CMP, Magnesium, and CBC. Discussed treatment plan with patient at bedside and patient verbalized agreement.   8:56 PM- Discussed that lab results were normal. Will order a CT of the C spine. Discussed treatment plan with patient at bedside and patient verbalized agreement.   10:04 PM- Discussed that CT results were normal and that symptoms are likely a side effect of the  Topamax. I reviewed side effects of Topamax in [facility] and paresthesias are a common side effect Advised her to stop taking this medication. Advised her to take Benadryl at home to manage symptoms so she can sleep. Patient stable for discharge. Discussed treatment plan with patient at bedside and patient verbalized agreement.   Results for orders placed during the hospital encounter of 04/11/14  COMPREHENSIVE METABOLIC PANEL      Result Value Ref Range   Sodium 139  137 - 147 mEq/L   Potassium 3.8  3.7 - 5.3 mEq/L   Chloride 101  96 - 112 mEq/L   CO2 28  19 - 32 mEq/L   Glucose, Bld 89  70 - 99 mg/dL   BUN 11  6 - 23 mg/dL   Creatinine, Ser 1.611.00  0.50 - 1.10 mg/dL   Calcium 9.5  8.4 - 09.610.5 mg/dL   Total Protein 7.7  6.0 - 8.3 g/dL   Albumin 3.4 (*) 3.5 - 5.2 g/dL   AST 11  0 - 37 U/L   ALT 8  0 - 35 U/L   Alkaline Phosphatase 60  39 - 117 U/L   Total Bilirubin 0.4  0.3 - 1.2 mg/dL   GFR calc non Af Amer 65 (*) >90 mL/min   GFR calc Af Amer 75 (*) >90 mL/min   MAGNESIUM      Result Value Ref Range   Magnesium 1.9  1.5 - 2.5 mg/dL  CBC WITH DIFFERENTIAL      Result Value Ref Range   WBC 7.0  4.0 - 10.5 K/uL   RBC 4.49  3.87 - 5.11 MIL/uL   Hemoglobin 13.5  12.0 - 15.0 g/dL   HCT 04.540.8  40.936.0 - 81.146.0 %   MCV 90.9  78.0 - 100.0 fL   MCH 30.1  26.0 - 34.0 pg   MCHC 33.1  30.0 - 36.0 g/dL   RDW 91.413.6  78.211.5 - 95.615.5 %   Platelets 271  150 - 400 K/uL   Neutrophils Relative % 65  43 - 77 %   Neutro Abs 4.6  1.7 - 7.7 K/uL   Lymphocytes Relative 24  12 - 46 %   Lymphs Abs 1.7  0.7 - 4.0 K/uL   Monocytes Relative 9  3 - 12 %   Monocytes Absolute 0.6  0.1 - 1.0 K/uL   Eosinophils Relative 2  0 - 5 %   Eosinophils Absolute 0.1  0.0 - 0.7 K/uL   Basophils Relative 0  0 - 1 %   Basophils Absolute 0.0  0.0 - 0.1 K/uL   Laboratory interpretation all normal    Ct Cervical Spine Wo Contrast  04/11/2014   CLINICAL DATA:  Tingling feeling at all extremities.  EXAM: CT CERVICAL SPINE WITHOUT CONTRAST  TECHNIQUE: Multidetector CT imaging of the cervical spine was performed without intravenous contrast. Multiplanar CT image reconstructions were also generated.  COMPARISON:  Cervical spine radiographs performed 02/03/2013  FINDINGS: There is no evidence of fracture or subluxation. Vertebral bodies demonstrate normal height and alignment. Scattered small anterior and posterior disc osteophyte complexes are seen along the cervical spine. Intervertebral disc spaces are preserved. Prevertebral soft tissues are within normal limits. The visualized bony foramina are grossly unremarkable.  The thyroid gland is unremarkable in appearance. The visualized lung apices are clear. No significant soft tissue abnormalities are seen. There is partial absence of multiple maxillary and mandibular teeth. The visualized portions of the brain are unremarkable in appearance.  IMPRESSION:  1. No evidence of fracture or subluxation along the cervical spine. 2. 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   Electronically Signed   By: Roanna Raider M.D.   On: 04/11/2014 21:51      EKG Interpretation None      MDM   Final diagnoses:  Numbness and tingling  Medication side effects    Plan discharge   Devoria Albe, MD, FACEP    I personally performed the services described in this documentation, which was scribed in my presence. The recorded information has been reviewed and considered.  Devoria Albe, MD, FACEP     Ward Givens, MD 04/12/14 [phone]

## 2014-04-11 NOTE — Discharge Instructions (Signed)
Stop the topamax, it is the most likely cause of your pins and needles in your arms and legs. You can take benadryl 50 mg at bedtime to help you sleep.  Let Dr Olena Leatherwood know about your ED visit.  Return if you get worse, such as weakness in your arm or leg, headache, chest pain, shortness of breath.

## 2014-09-22 ENCOUNTER — Emergency Department (HOSPITAL_COMMUNITY)
Admission: EM | Admit: 2014-09-22 | Discharge: 2014-09-22 | Disposition: A | Discharge status: Home / Self Care | Payer: PRIVATE HEALTH INSURANCE | Attending: Emergency Medicine | Admitting: Emergency Medicine

## 2014-09-22 ENCOUNTER — Encounter (HOSPITAL_COMMUNITY): Payer: Self-pay | Admitting: Emergency Medicine

## 2014-09-22 DIAGNOSIS — E669 Obesity, unspecified: Secondary | ICD-10-CM | POA: Insufficient documentation

## 2014-09-22 DIAGNOSIS — Z79899 Other long term (current) drug therapy: Secondary | ICD-10-CM | POA: Insufficient documentation

## 2014-09-22 DIAGNOSIS — Z7951 Long term (current) use of inhaled steroids: Secondary | ICD-10-CM | POA: Insufficient documentation

## 2014-09-22 DIAGNOSIS — I10 Essential (primary) hypertension: Secondary | ICD-10-CM | POA: Diagnosis not present

## 2014-09-22 DIAGNOSIS — R05 Cough: Secondary | ICD-10-CM | POA: Diagnosis present

## 2014-09-22 DIAGNOSIS — J209 Acute bronchitis, unspecified: Secondary | ICD-10-CM | POA: Insufficient documentation

## 2014-09-22 DIAGNOSIS — Z7982 Long term (current) use of aspirin: Secondary | ICD-10-CM | POA: Insufficient documentation

## 2014-09-22 MED ORDER — DEXAMETHASONE SODIUM PHOSPHATE 10 MG/ML IJ SOLN
10.0000 mg | Freq: Once | INTRAMUSCULAR | Status: AC
  Administered 2014-09-22: 10 mg via INTRAMUSCULAR
  Filled 2014-09-22: qty 1

## 2014-09-22 MED ORDER — AZITHROMYCIN 250 MG PO TABS: ORAL_TABLET | ORAL | Status: DC

## 2014-09-22 MED ORDER — HYDROCOD POLST-CHLORPHEN POLST 10-8 MG/5ML PO LQCR
5.0000 mL | Freq: Once | ORAL | Status: AC
  Administered 2014-09-22: 5 mL via ORAL
  Filled 2014-09-22: qty 5

## 2014-09-22 MED ORDER — HYDROCOD POLST-CHLORPHEN POLST 10-8 MG/5ML PO LQCR: 5.0000 mL | Freq: Two times a day (BID) | ORAL | Status: DC | PRN

## 2014-09-22 NOTE — Discharge Instructions (Signed)

## 2014-09-22 NOTE — ED Provider Notes (Signed)
CSN: [id number]     Arrival date & time 09/22/14  0231 History   First MD Initiated Contact with Patient 09/22/14 0302     Chief Complaint  Patient presents with  . Cough     (Consider location/radiation/quality/duration/timing/severity/associated sxs/prior Treatment) HPI This is a 48 year old female with about a 10 day history of cough now productive of clear sputum. Has been accompanied by shortness of breath which she has been treating successfully with home albuterol neb treatments. Her primary complaint this morning is unreliable his cough. The cough is rattly and paroxysmal. She denies fever. She has had nasal congestion. She denies nausea, vomiting or diarrhea. She describes her cough as severe.  Past Medical History  Diagnosis Date  . Hypertension   . Obesity    History reviewed. No pertinent past surgical history. History reviewed. No pertinent family history. History  Substance Use Topics  . Smoking status: Never Smoker   . Smokeless tobacco: Not on file  . Alcohol Use: No   OB History   Grav Para Term Preterm Abortions TAB SAB Ect Mult Living                 Review of Systems  All other systems reviewed and are negative.  Allergies  Review of patient's allergies indicates no known allergies.  Home Medications   Prior to Admission medications   Medication Sig Start Date End Date Taking? Authorizing Provider  albuterol-ipratropium (COMBIVENT) 18-103 MCG/ACT inhaler Inhale 2 puffs into the lungs every 6 (six) hours as needed for shortness of breath. 10/22/12   Kathie Dike, PA-C  aspirin 81 MG chewable tablet Chew 81 mg by mouth 2 (two) times daily.     Historical Provider, MD  carvedilol (COREG) 6.25 MG tablet Take 6.25 mg by mouth 2 (two) times daily.     Historical Provider, MD  fluticasone (FLONASE) 50 MCG/ACT nasal spray Inhale 1 spray into the lungs daily. 03/07/14   Historical Provider, MD  furosemide (LASIX) 40 MG tablet Take 40-80 mg by mouth daily. 80  mg in the morning and 40 in the afternoon.    Historical Provider, MD  potassium chloride SA (K-DUR,KLOR-CON) 20 MEQ tablet Take 20 mEq by mouth daily. *Take one tablet by  Mouth only on days that Lasix (Furosemide) is taken    Historical Provider, MD  topiramate (TOPAMAX) 50 MG tablet Take 50 mg by mouth 2 (two) times daily. 03/11/14   Historical Provider, MD   BP 173/112  Pulse 101  Temp(Src) 98.2 F (36.8 C) (Oral)  Resp 20  Ht 5\' 3"  (1.6 m)  Wt 290 lb (131.543 kg)  BMI 51.38 kg/m2  SpO2 97%  LMP 09/12/2014  Physical Exam General: Well-developed, obese female in no acute distress; appearance consistent with age of record HENT: normocephalic; atraumatic; pharynx normal; minimal nasal congestion Eyes: pupils equal, round and reactive to light; extraocular muscles intact Neck: supple Heart: regular rate and rhythm Lungs: clear to auscultation bilaterally with good air movement; frequent paroxysms of rattly cough Abdomen: soft; nondistended; nontender; bowel sounds present Extremities: No deformity; full range of motion; pulses normal Neurologic: Awake, alert and oriented; motor function intact in all extremities and symmetric; no facial droop Skin: Warm and dry Psychiatric: Normal mood and affect    ED Course  Procedures (including critical care time)  MDM      Hanley Seamen, MD 09/22/14 1030

## 2014-09-22 NOTE — ED Notes (Signed)
Pt c/o cough and congestion x 1 week; pt states her sputum is clear and thick; pt c/o sob that started this evening

## 2014-09-22 NOTE — ED Notes (Signed)
Pt states she has been taking dayquil for cold symptoms.

## 2014-10-24 ENCOUNTER — Emergency Department (HOSPITAL_COMMUNITY)
Admission: EM | Admit: 2014-10-24 | Discharge: 2014-10-24 | Disposition: A | Discharge status: Home / Self Care | Payer: PRIVATE HEALTH INSURANCE | Attending: Emergency Medicine | Admitting: Emergency Medicine

## 2014-10-24 ENCOUNTER — Encounter (HOSPITAL_COMMUNITY): Payer: Self-pay | Admitting: *Deleted

## 2014-10-24 ENCOUNTER — Emergency Department (HOSPITAL_COMMUNITY): Payer: PRIVATE HEALTH INSURANCE

## 2014-10-24 DIAGNOSIS — R05 Cough: Secondary | ICD-10-CM | POA: Insufficient documentation

## 2014-10-24 DIAGNOSIS — R079 Chest pain, unspecified: Secondary | ICD-10-CM | POA: Insufficient documentation

## 2014-10-24 DIAGNOSIS — Z7952 Long term (current) use of systemic steroids: Secondary | ICD-10-CM | POA: Insufficient documentation

## 2014-10-24 DIAGNOSIS — R609 Edema, unspecified: Secondary | ICD-10-CM | POA: Insufficient documentation

## 2014-10-24 DIAGNOSIS — I10 Essential (primary) hypertension: Secondary | ICD-10-CM | POA: Diagnosis not present

## 2014-10-24 DIAGNOSIS — E669 Obesity, unspecified: Secondary | ICD-10-CM | POA: Insufficient documentation

## 2014-10-24 DIAGNOSIS — Z792 Long term (current) use of antibiotics: Secondary | ICD-10-CM | POA: Diagnosis not present

## 2014-10-24 DIAGNOSIS — Z7951 Long term (current) use of inhaled steroids: Secondary | ICD-10-CM | POA: Diagnosis not present

## 2014-10-24 DIAGNOSIS — Z79899 Other long term (current) drug therapy: Secondary | ICD-10-CM | POA: Diagnosis not present

## 2014-10-24 DIAGNOSIS — Z7982 Long term (current) use of aspirin: Secondary | ICD-10-CM | POA: Diagnosis not present

## 2014-10-24 DIAGNOSIS — R059 Cough, unspecified: Secondary | ICD-10-CM

## 2014-10-24 MED ORDER — DOXYCYCLINE HYCLATE 100 MG PO CAPS: 100.0000 mg | ORAL_CAPSULE | Freq: Two times a day (BID) | ORAL | Status: DC

## 2014-10-24 MED ORDER — PREDNISONE 50 MG PO TABS
60.0000 mg | ORAL_TABLET | Freq: Once | ORAL | Status: AC
  Administered 2014-10-24: 60 mg via ORAL
  Filled 2014-10-24 (×2): qty 1

## 2014-10-24 MED ORDER — PREDNISONE 20 MG PO TABS: 40.0000 mg | ORAL_TABLET | Freq: Every day | ORAL | Status: DC

## 2014-10-24 NOTE — Discharge Instructions (Signed)
Cough, Adult  A cough is a reflex that helps clear your throat and airways. It can help heal the body or may be a reaction to an irritated airway. A cough may only last 2 or 3 weeks (acute) or may last more than 8 weeks (chronic).  CAUSES Acute cough:  Viral or bacterial infections. Chronic cough:  Infections.  Allergies.  Asthma.  Post-nasal drip.  Smoking.  Heartburn or acid reflux.  Some medicines.  Chronic lung problems (COPD).  Cancer. SYMPTOMS   Cough.  Fever.  Chest pain.  Increased breathing rate.  High-pitched whistling sound when breathing (wheezing).  Colored mucus that you cough up (sputum). TREATMENT   A bacterial cough may be treated with antibiotic medicine.  A viral cough must run its course and will not respond to antibiotics.  Your caregiver may recommend other treatments if you have a chronic cough. HOME CARE INSTRUCTIONS   Only take over-the-counter or prescription medicines for pain, discomfort, or fever as directed by your caregiver. Use cough suppressants only as directed by your caregiver.  Use a cold steam vaporizer or humidifier in your bedroom or home to help loosen secretions.  Sleep in a semi-upright position if your cough is worse at night.  Rest as needed.  Stop smoking if you smoke. SEEK IMMEDIATE MEDICAL CARE IF:   You have pus in your sputum.  Your cough starts to worsen.  You cannot control your cough with suppressants and are losing sleep.  You begin coughing up blood.  You have difficulty breathing.  You develop pain which is getting worse or is uncontrolled with medicine.  You have a fever. MAKE SURE YOU:   Understand these instructions.  Will watch your condition.  Will get help right away if you are not doing well or get worse. Document Released: 06/03/2011 Document Revised: 02/27/2012 Document Reviewed: 06/03/2011 [employer] Patient Information 2015 [employer]. This information is not intended  to replace advice given to you by your health care provider. Make sure you discuss any questions you have with your health care provider.  

## 2014-10-24 NOTE — ED Notes (Signed)
Discharge instructions given, pt demonstrated teach back and verbal understanding. No concerns voiced.  

## 2014-10-24 NOTE — ED Notes (Addendum)
Pt states she has had a cold/ cough x 2 week. Was diagnosed with bronchitis here and ws given an antibiotic with no relief. Pt states it hurts all over when she coughs. Pt also c/o stomach pain. Pt states she has quit taking most of her meds because she has lost 30 lbs.

## 2014-10-24 NOTE — ED Provider Notes (Signed)
CSN: [id number]     Arrival date & time 10/24/14  1917 History   First MD Initiated Contact with Patient 10/24/14 2045     Chief Complaint  Patient presents with  . Cough     (Consider location/radiation/quality/duration/timing/severity/associated sxs/prior Treatment) Patient is a 48 y.o. female presenting with cough. The history is provided by the patient.  Cough Associated symptoms: chest pain   Associated symptoms: no headaches, no rash and no shortness of breath   patient's had a cough for the last 6 weeks. Occasional production. No fevers. She's been seen in the ED a month ago and was given azithromycin. She's got no better. States that she has had to delay her work because of the coughing. Mild swelling in her legs. No fevers. No real shortness of breath. She has had some relief with cough medicines, but states it makes her too sleepy. She has an inhaler at home. She does not smoke. Mild dull chest pain, that she attributes to the coughing.  Past Medical History  Diagnosis Date  . Hypertension   . Obesity    History reviewed. No pertinent past surgical history. History reviewed. No pertinent family history. History  Substance Use Topics  . Smoking status: Never Smoker   . Smokeless tobacco: Not on file  . Alcohol Use: No   OB History    No data available     Review of Systems  Constitutional: Negative for activity change and appetite change.  Eyes: Negative for pain.  Respiratory: Positive for cough. Negative for chest tightness and shortness of breath.   Cardiovascular: Positive for chest pain. Negative for leg swelling.  Gastrointestinal: Negative for nausea, vomiting, abdominal pain and diarrhea.  Genitourinary: Negative for flank pain.  Musculoskeletal: Negative for back pain and neck stiffness.  Skin: Negative for rash.  Neurological: Negative for weakness, numbness and headaches.  Psychiatric/Behavioral: Negative for behavioral problems.      Allergies   Review of patient's allergies indicates no known allergies.  Home Medications   Prior to Admission medications   Medication Sig Start Date End Date Taking? Authorizing Provider  albuterol-ipratropium (COMBIVENT) 18-103 MCG/ACT inhaler Inhale 2 puffs into the lungs every 6 (six) hours as needed for shortness of breath. 10/22/12   Kathie Dike, PA-C  aspirin 81 MG chewable tablet Chew 81 mg by mouth 2 (two) times daily.     Historical Provider, MD  azithromycin (ZITHROMAX) 250 MG tablet Take first 2 tablets today, then 1 every day until finished. 09/22/14   John L Molpus, MD  carvedilol (COREG) 6.25 MG tablet Take 6.25 mg by mouth 2 (two) times daily.     Historical Provider, MD  chlorpheniramine-HYDROcodone (TUSSIONEX PENNKINETIC ER) 10-8 MG/5ML LQCR Take 5 mLs by mouth every 12 (twelve) hours as needed for cough. 09/22/14   Carlisle Beers Molpus, MD  doxycycline (VIBRAMYCIN) 100 MG capsule Take 1 capsule (100 mg total) by mouth 2 (two) times daily. 10/24/14   Juliet Rude. Ximenna Fonseca, MD  fluticasone (FLONASE) 50 MCG/ACT nasal spray Inhale 1 spray into the lungs daily. 03/07/14   Historical Provider, MD  furosemide (LASIX) 40 MG tablet Take 40-80 mg by mouth daily. 80 mg in the morning and 40 in the afternoon.    Historical Provider, MD  potassium chloride SA (K-DUR,KLOR-CON) 20 MEQ tablet Take 20 mEq by mouth daily. *Take one tablet by  Mouth only on days that Lasix (Furosemide) is taken    Historical Provider, MD  predniSONE (DELTASONE) 20 MG tablet Take  2 tablets (40 mg total) by mouth daily. 10/24/14   Juliet RudeNathan R. Tristian Sickinger, MD  topiramate (TOPAMAX) 50 MG tablet Take 50 mg by mouth 2 (two) times daily. 03/11/14   Historical Provider, MD   BP 166/107 mmHg  Pulse 106  Temp(Src) 98.5 F (36.9 C) (Oral)  Resp 18  Ht 5\' 3"  (1.6 m)  Wt 285 lb (129.275 kg)  BMI 50.50 kg/m2  SpO2 96%  LMP 10/05/2014 Physical Exam  Constitutional: She is oriented to person, place, and time. She appears well-developed and  well-nourished.  HENT:  Head: Normocephalic and atraumatic.  Eyes: EOM are normal. Pupils are equal, round, and reactive to light.  Neck: Normal range of motion. Neck supple.  Cardiovascular: Normal rate, regular rhythm and normal heart sounds.   No murmur heard. Pulmonary/Chest: No respiratory distress. She has no wheezes. She has no rales.  Occasional cough  Abdominal: Soft. Bowel sounds are normal. She exhibits no distension. There is no tenderness. There is no rebound and no guarding.  Musculoskeletal: Normal range of motion. She exhibits edema.  Mild bilateral lower extremity pitting edema  Neurological: She is alert and oriented to person, place, and time. No cranial nerve deficit.  Skin: Skin is warm and dry.  Psychiatric: She has a normal mood and affect. Her speech is normal.  Nursing note and vitals reviewed.   ED Course  Procedures (including critical care time) Labs Review Labs Reviewed - No data to display  Imaging Review Dg Chest 2 View  10/24/2014   CLINICAL DATA:  Cough, shortness of breath  EXAM: CHEST  2 VIEW  COMPARISON:  12/22/2013  FINDINGS: Mild patchy bibasilar opacities, likely atelectasis. No focal consolidation. No frank interstitial edema. No pleural effusion or pneumothorax.  Cardiomegaly.  Degenerative changes of the visualized thoracolumbar spine.  IMPRESSION: No evidence of acute cardiopulmonary disease.   Electronically Signed   By: Charline BillsSriyesh  Krishnan M.D.   On: 10/24/2014 19:49     EKG Interpretation None      MDM   Final diagnoses:  Cough    Patient with cough. Has been seen for same. X-ray no pneumonia. No CHF. Will give doxycycline since it is been 6 weeks. Will give course steroids also. May need pulmonology follow-up. Doubt cardiac cause and doubt pulmonary embolism. Patient has had history of hypertension and states her blood pressure medicines were stopped she no longer had CHF. She is instructed to start back on her medications due to her  hypertension will need to follow with her primary care doctor    Juliet Rudeathan R. Rubin PayorPickering, MD 10/24/14 2125

## 2014-10-28 ENCOUNTER — Emergency Department (HOSPITAL_COMMUNITY)
Admission: EM | Admit: 2014-10-28 | Discharge: 2014-10-28 | Disposition: A | Discharge status: Home / Self Care | Payer: PRIVATE HEALTH INSURANCE | Attending: Emergency Medicine | Admitting: Emergency Medicine

## 2014-10-28 ENCOUNTER — Emergency Department (HOSPITAL_COMMUNITY): Payer: PRIVATE HEALTH INSURANCE

## 2014-10-28 ENCOUNTER — Encounter (HOSPITAL_COMMUNITY): Payer: Self-pay | Admitting: Emergency Medicine

## 2014-10-28 DIAGNOSIS — E669 Obesity, unspecified: Secondary | ICD-10-CM | POA: Diagnosis not present

## 2014-10-28 DIAGNOSIS — I509 Heart failure, unspecified: Secondary | ICD-10-CM | POA: Insufficient documentation

## 2014-10-28 DIAGNOSIS — I10 Essential (primary) hypertension: Secondary | ICD-10-CM | POA: Diagnosis not present

## 2014-10-28 DIAGNOSIS — Z862 Personal history of diseases of the blood and blood-forming organs and certain disorders involving the immune mechanism: Secondary | ICD-10-CM | POA: Insufficient documentation

## 2014-10-28 DIAGNOSIS — N920 Excessive and frequent menstruation with regular cycle: Secondary | ICD-10-CM | POA: Diagnosis not present

## 2014-10-28 DIAGNOSIS — Z792 Long term (current) use of antibiotics: Secondary | ICD-10-CM | POA: Insufficient documentation

## 2014-10-28 DIAGNOSIS — Z7982 Long term (current) use of aspirin: Secondary | ICD-10-CM | POA: Diagnosis not present

## 2014-10-28 DIAGNOSIS — R059 Cough, unspecified: Secondary | ICD-10-CM

## 2014-10-28 DIAGNOSIS — Z7952 Long term (current) use of systemic steroids: Secondary | ICD-10-CM | POA: Diagnosis not present

## 2014-10-28 DIAGNOSIS — R05 Cough: Secondary | ICD-10-CM

## 2014-10-28 DIAGNOSIS — Z79899 Other long term (current) drug therapy: Secondary | ICD-10-CM | POA: Insufficient documentation

## 2014-10-28 DIAGNOSIS — R0602 Shortness of breath: Secondary | ICD-10-CM | POA: Diagnosis not present

## 2014-10-28 HISTORY — DX: Anemia, unspecified: D64.9

## 2014-10-28 HISTORY — DX: Encounter for other specified aftercare: Z51.89

## 2014-10-28 LAB — COMPREHENSIVE METABOLIC PANEL
ALK PHOS: 55 U/L (ref 39–117)
ALT: 74 U/L — ABNORMAL HIGH (ref 0–35)
ANION GAP: 12 (ref 5–15)
AST: 48 U/L — ABNORMAL HIGH (ref 0–37)
Albumin: 3.2 g/dL — ABNORMAL LOW (ref 3.5–5.2)
BILIRUBIN TOTAL: 0.4 mg/dL (ref 0.3–1.2)
BUN: 10 mg/dL (ref 6–23)
CHLORIDE: 101 meq/L (ref 96–112)
CO2: 29 mEq/L (ref 19–32)
Calcium: 8.6 mg/dL (ref 8.4–10.5)
Creatinine, Ser: 0.89 mg/dL (ref 0.50–1.10)
GFR calc non Af Amer: 74 mL/min — ABNORMAL LOW (ref >90)
GFR, EST AFRICAN AMERICAN: 86 mL/min — AB (ref >90)
GLUCOSE: 97 mg/dL (ref 70–99)
POTASSIUM: 3.3 meq/L — AB (ref 3.7–5.3)
Sodium: 142 mEq/L (ref 137–147)
Total Protein: 6.9 g/dL (ref 6.0–8.3)

## 2014-10-28 LAB — CBC WITH DIFFERENTIAL/PLATELET
Basophils Absolute: 0 10*3/uL (ref 0.0–0.1)
Basophils Relative: 0 % (ref 0–1)
Eosinophils Absolute: 0.1 10*3/uL (ref 0.0–0.7)
Eosinophils Relative: 2 % (ref 0–5)
HCT: 36.9 % (ref 36.0–46.0)
HEMOGLOBIN: 12 g/dL (ref 12.0–15.0)
LYMPHS ABS: 1.8 10*3/uL (ref 0.7–4.0)
LYMPHS PCT: 28 % (ref 12–46)
MCH: 29.5 pg (ref 26.0–34.0)
MCHC: 32.5 g/dL (ref 30.0–36.0)
MCV: 90.7 fL (ref 78.0–100.0)
MONOS PCT: 12 % (ref 3–12)
Monocytes Absolute: 0.7 10*3/uL (ref 0.1–1.0)
NEUTROS ABS: 3.7 10*3/uL (ref 1.7–7.7)
NEUTROS PCT: 58 % (ref 43–77)
Platelets: 344 10*3/uL (ref 150–400)
RBC: 4.07 MIL/uL (ref 3.87–5.11)
RDW: 14.2 % (ref 11.5–15.5)
WBC: 6.3 10*3/uL (ref 4.0–10.5)

## 2014-10-28 MED ORDER — MORPHINE SULFATE 4 MG/ML IJ SOLN
4.0000 mg | INTRAMUSCULAR | Status: DC | PRN
  Administered 2014-10-28: 4 mg via INTRAVENOUS
  Filled 2014-10-28: qty 1

## 2014-10-28 MED ORDER — LISINOPRIL 20 MG PO TABS: ORAL_TABLET | ORAL | Status: DC

## 2014-10-28 MED ORDER — KETOROLAC TROMETHAMINE 30 MG/ML IJ SOLN
30.0000 mg | Freq: Once | INTRAMUSCULAR | Status: AC
  Administered 2014-10-28: 30 mg via INTRAVENOUS
  Filled 2014-10-28: qty 1

## 2014-10-28 MED ORDER — BENZONATATE 100 MG PO CAPS: 100.0000 mg | ORAL_CAPSULE | Freq: Three times a day (TID) | ORAL | Status: DC

## 2014-10-28 MED ORDER — ONDANSETRON HCL 4 MG/2ML IJ SOLN
4.0000 mg | Freq: Once | INTRAMUSCULAR | Status: AC
  Administered 2014-10-28: 4 mg via INTRAVENOUS
  Filled 2014-10-28: qty 2

## 2014-10-28 NOTE — ED Notes (Signed)
Patient c/o cough with shortness of breath x 6 weeks. Per patient seen here in ER on 10/24/14 and diagnosed with bronchitis. Patient was to follow-up with PCP but states she couldn't be seen till Thursday. Patient reports being given vibramycin 100mg  and another medication (unsure of name) with no relief. Patient now reports some heavy vaginal bleeding that started today. Patient reports hx of anemia in past with blood transfusions due to same type of vaginal bleeding.

## 2014-10-28 NOTE — Discharge Instructions (Signed)
Cough, Adult  A cough is a reflex that helps clear your throat and airways. It can help heal the body or may be a reaction to an irritated airway. A cough may only last 2 or 3 weeks (acute) or may last more than 8 weeks (chronic).  CAUSES Acute cough:  Viral or bacterial infections. Chronic cough:  Infections.  Allergies.  Asthma.  Post-nasal drip.  Smoking.  Heartburn or acid reflux.  Some medicines.  Chronic lung problems (COPD).  Cancer. SYMPTOMS   Cough.  Fever.  Chest pain.  Increased breathing rate.  High-pitched whistling sound when breathing (wheezing).  Colored mucus that you cough up (sputum). TREATMENT   A bacterial cough may be treated with antibiotic medicine.  A viral cough must run its course and will not respond to antibiotics.  Your caregiver may recommend other treatments if you have a chronic cough. HOME CARE INSTRUCTIONS   Only take over-the-counter or prescription medicines for pain, discomfort, or fever as directed by your caregiver. Use cough suppressants only as directed by your caregiver.  Use a cold steam vaporizer or humidifier in your bedroom or home to help loosen secretions.  Sleep in a semi-upright position if your cough is worse at night.  Rest as needed.  Stop smoking if you smoke. SEEK IMMEDIATE MEDICAL CARE IF:   You have pus in your sputum.  Your cough starts to worsen.  You cannot control your cough with suppressants and are losing sleep.  You begin coughing up blood.  You have difficulty breathing.  You develop pain which is getting worse or is uncontrolled with medicine.  You have a fever. MAKE SURE YOU:   Understand these instructions.  Will watch your condition.  Will get help right away if you are not doing well or get worse. Document Released: 06/03/2011 Document Revised: 02/27/2012 Document Reviewed: 06/03/2011 [facility] Patient Information 2015 [place], [place]. This information is not intended  to replace advice given to you by your health care provider. Make sure you discuss any questions you have with your health care provider.  Abnormal Uterine Bleeding Abnormal uterine bleeding means bleeding from the vagina that is not your normal menstrual period. This can be:  Bleeding or spotting between periods.  Bleeding after sex (sexual intercourse).  Bleeding that is heavier or more than normal.  Periods that last longer than usual.  Bleeding after menopause. There are many problems that may cause this. Treatment will depend on the cause of the bleeding. Any kind of bleeding that is not normal should be reviewed by your doctor.  HOME CARE Watch your condition for any changes. These actions may lessen any discomfort you are having:  Do not use tampons or douches as told by your doctor.  Change your pads often. You should get regular pelvic exams and Pap tests. Keep all appointments for tests as told by your doctor. GET HELP IF:  You are bleeding for more than 1 week.  You feel dizzy at times. GET HELP RIGHT AWAY IF:   You pass out.  You have to change pads every 15 to 30 minutes.  You have belly pain.  You have a fever.  You become sweaty or weak.  You are passing large blood clots from the vagina.  You feel sick to your stomach (nauseous) and throw up (vomit). MAKE SURE YOU:  Understand these instructions.  Will watch your condition.  Will get help right away if you are not doing well or get worse. Document Released:  10/02/2009 Document Revised: 12/10/2013 Document Reviewed: 07/04/2013 [facility] Patient Information 2015 [place], [place]. This information is not intended to replace advice given to you by your health care provider. Make sure you discuss any questions you have with your health care provider.  Hypertension Hypertension, commonly called high blood pressure, is when the force of blood pumping through your arteries is too strong. Your arteries are  the blood vessels that carry blood from your heart throughout your body. A blood pressure reading consists of a higher number over a lower number, such as 110/72. The higher number (systolic) is the pressure inside your arteries when your heart pumps. The lower number (diastolic) is the pressure inside your arteries when your heart relaxes. Ideally you want your blood pressure below 120/80. Hypertension forces your heart to work harder to pump blood. Your arteries may become narrow or stiff. Having hypertension puts you at risk for heart disease, stroke, and other problems.  RISK FACTORS Some risk factors for high blood pressure are controllable. Others are not.  Risk factors you cannot control include:   Race. You may be at higher risk if you are African American.  Age. Risk increases with age.  Gender. Men are at higher risk than women before age 11 years. After age 64, women are at higher risk than men. Risk factors you can control include:  Not getting enough exercise or physical activity.  Being overweight.  Getting too much fat, sugar, calories, or salt in your diet.  Drinking too much alcohol. SIGNS AND SYMPTOMS Hypertension does not usually cause signs or symptoms. Extremely high blood pressure (hypertensive crisis) may cause headache, anxiety, shortness of breath, and nosebleed. DIAGNOSIS  To check if you have hypertension, your health care provider will measure your blood pressure while you are seated, with your arm held at the level of your heart. It should be measured at least twice using the same arm. Certain conditions can cause a difference in blood pressure between your right and left arms. A blood pressure reading that is higher than normal on one occasion does not mean that you need treatment. If one blood pressure reading is high, ask your health care provider about having it checked again. TREATMENT  Treating high blood pressure includes making lifestyle changes and  possibly taking medicine. Living a healthy lifestyle can help lower high blood pressure. You may need to change some of your habits. Lifestyle changes may include:  Following the DASH diet. This diet is high in fruits, vegetables, and whole grains. It is low in salt, red meat, and added sugars.  Getting at least 2 hours of brisk physical activity every week.  Losing weight if necessary.  Not smoking.  Limiting alcoholic beverages.  Learning ways to reduce stress. If lifestyle changes are not enough to get your blood pressure under control, your health care provider may prescribe medicine. You may need to take more than one. Work closely with your health care provider to understand the risks and benefits. HOME CARE INSTRUCTIONS  Have your blood pressure rechecked as directed by your health care provider.   Take medicines only as directed by your health care provider. Follow the directions carefully. Blood pressure medicines must be taken as prescribed. The medicine does not work as well when you skip doses. Skipping doses also puts you at risk for problems.   Do not smoke.   Monitor your blood pressure at home as directed by your health care provider. SEEK MEDICAL CARE IF:   You  think you are having a reaction to medicines taken.  You have recurrent headaches or feel dizzy.  You have swelling in your ankles.  You have trouble with your vision. SEEK IMMEDIATE MEDICAL CARE IF:  You develop a severe headache or confusion.  You have unusual weakness, numbness, or feel faint.  You have severe chest or abdominal pain.  You vomit repeatedly.  You have trouble breathing. MAKE SURE YOU:   Understand these instructions.  Will watch your condition.  Will get help right away if you are not doing well or get worse. Document Released: 12/05/2005 Document Revised: 04/21/2014 Document Reviewed: 09/27/2013 [facility] Patient Information 2015 [place], [place]. This information is  not intended to replace advice given to you by your health care provider. Make sure you discuss any questions you have with your health care provider.

## 2014-10-28 NOTE — ED Provider Notes (Signed)
CSN: [id number]     Arrival date & time 10/28/14  1800 History  This chart was scribed for Rolland Porter, MD by Haywood Pao, ED Scribe. The patient was seen in [facility] and the patient's care was started at 6:59 PM.    Chief Complaint  Patient presents with  . Cough  . Shortness of Breath  . Vaginal Bleeding    The history is provided by the patient. No language interpreter was used.    HPI Comments: [name] is a 48 y.o. female with a history of anemia, CHF, HTN who presents to the Emergency Department complaining of cough, SOB and vaginal bleeding. The bleeding starting today and it  depobromaira and she stopped taking it about 6 months and she has had a normal period which lasted 3-4 days. Today she notes she is having abnormal profuse bleeding. Pt states she cant catch her breathing and has a cough, she was seen Friday and told she had bronchitis, she was given medication and told to use her inhaler.  Pt has CP, abdominal pain, pain while laying down,  Pt has never smoked   Past Medical History  Diagnosis Date  . Hypertension   . Obesity   . Blood transfusion without reported diagnosis   . Anemia   . CHF (congestive heart failure)    Past Surgical History  Procedure Laterality Date  . Cesarean section     Family History  Problem Relation Age of Onset  . Cancer Mother   . Heart attack Father   . Cancer Brother    History  Substance Use Topics  . Smoking status: Never Smoker   . Smokeless tobacco: Never Used  . Alcohol Use: No   OB History    Gravida Para Term Preterm AB TAB SAB Ectopic Multiple Living   3 3 3       3      Review of Systems  Constitutional: Negative for fever, chills, diaphoresis, appetite change and fatigue.  HENT: Negative for mouth sores, sore throat and trouble swallowing.   Eyes: Negative for visual disturbance.  Respiratory: Negative for cough, chest tightness, shortness of breath and wheezing.   Cardiovascular: Negative for  chest pain.  Gastrointestinal: Negative for nausea, vomiting, abdominal pain, diarrhea and abdominal distention.  Endocrine: Negative for polydipsia, polyphagia and polyuria.  Genitourinary: Negative for dysuria, frequency and hematuria.  Musculoskeletal: Negative for gait problem.  Skin: Negative for color change, pallor and rash.  Neurological: Negative for dizziness, syncope, light-headedness and headaches.  Hematological: Does not bruise/bleed easily.  Psychiatric/Behavioral: Negative for behavioral problems and confusion.      Allergies  Review of patient's allergies indicates no known allergies.  Home Medications   Prior to Admission medications   Medication Sig Start Date End Date Taking? Authorizing Provider  doxycycline (VIBRAMYCIN) 100 MG capsule Take 1 capsule (100 mg total) by mouth 2 (two) times daily. 10/24/14  Yes Nathan R. Pickering, MD  fluticasone (FLONASE) 50 MCG/ACT nasal spray Inhale 1 spray into the lungs daily. 03/07/14  Yes Historical Provider, MD  furosemide (LASIX) 40 MG tablet Take 40 mg by mouth daily as needed for fluid.    Yes Historical Provider, MD  albuterol-ipratropium (COMBIVENT) 18-103 MCG/ACT inhaler Inhale 2 puffs into the lungs every 6 (six) hours as needed for shortness of breath. 10/22/12   Kathie Dike, PA-C  aspirin 81 MG chewable tablet Chew 81 mg by mouth 2 (two) times daily.     Historical Provider, MD  azithromycin (ZITHROMAX) 250 MG tablet Take first 2 tablets today, then 1 every day until finished. 09/22/14   John L Molpus, MD  benzonatate (TESSALON) 100 MG capsule Take 1 capsule (100 mg total) by mouth every 8 (eight) hours. 10/28/14   Rolland PorterMark Jahliyah Trice, MD  carvedilol (COREG) 6.25 MG tablet Take 6.25 mg by mouth 2 (two) times daily.     Historical Provider, MD  chlorpheniramine-HYDROcodone (TUSSIONEX PENNKINETIC ER) 10-8 MG/5ML LQCR Take 5 mLs by mouth every 12 (twelve) hours as needed for cough. Patient not taking: Reported on 10/28/2014  09/22/14   Carlisle BeersJohn L Molpus, MD  lisinopril (PRINIVIL,ZESTRIL) 20 MG tablet 1 po q day 10/28/14   Rolland PorterMark Taaliyah Delpriore, MD  potassium chloride SA (K-DUR,KLOR-CON) 20 MEQ tablet Take 20 mEq by mouth daily as needed. *Take one tablet by  Mouth only on days that Lasix (Furosemide) is taken    Historical Provider, MD  predniSONE (DELTASONE) 20 MG tablet Take 2 tablets (40 mg total) by mouth daily. 10/24/14   Juliet RudeNathan R. Pickering, MD  topiramate (TOPAMAX) 50 MG tablet Take 50 mg by mouth 2 (two) times daily. 03/11/14   Historical Provider, MD   BP 169/112 mmHg  Pulse 108  Temp(Src) 99.3 F (37.4 C) (Oral)  Resp 18  Ht 5\' 3"  (1.6 m)  Wt 280 lb (127.007 kg)  BMI 49.61 kg/m2  SpO2 96%  LMP 10/28/2014 Physical Exam  Constitutional: She is oriented to person, place, and time. She appears well-developed and well-nourished. No distress.  Obese   HENT:  Head: Normocephalic.  Eyes: Conjunctivae are normal. Pupils are equal, round, and reactive to light. No scleral icterus.  Neck: Normal range of motion. Neck supple. No thyromegaly present.  Cardiovascular: Normal rate and regular rhythm.  Exam reveals no gallop and no friction rub.   No murmur heard. Pulmonary/Chest: Effort normal and breath sounds normal. No respiratory distress. She has no wheezes. She has no rales.  Lungs sound clear.  Abdominal: Soft. Bowel sounds are normal. She exhibits no distension. There is no tenderness. There is no rebound.  Mild suprapubic pain but no tenderness.  Musculoskeletal: Normal range of motion.  Neurological: She is alert and oriented to person, place, and time.  Skin: Skin is warm and dry. No rash noted.  Psychiatric: She has a normal mood and affect. Her behavior is normal.    ED Course  Procedures  DIAGNOSTIC STUDIES: Oxygen Saturation is 96% on room air, adequate by my interpretation.    COORDINATION OF CARE: 8:54 PM Discussed treatment plan with pt at bedside and pt agreed to plan.   Labs Review Labs Reviewed   COMPREHENSIVE METABOLIC PANEL - Abnormal; Notable for the following:    Potassium 3.3 (*)    Albumin 3.2 (*)    AST 48 (*)    ALT 74 (*)    GFR calc non Af Amer 74 (*)    GFR calc Af Amer 86 (*)    All other components within normal limits  CBC WITH DIFFERENTIAL    Imaging Review Dg Chest 2 View  10/28/2014   CLINICAL DATA:  Shortness of breath for 6 weeks, cough, worsening today  EXAM: CHEST  2 VIEW  COMPARISON:  10/24/2014  FINDINGS: Cardiomediastinal silhouette is unremarkable. No acute infiltrate or pulmonary edema. Mild perihilar bronchitic changes. Mild degenerative changes mid thoracic spine.  IMPRESSION: No acute infiltrate or pulmonary edema. Mild perihilar bronchitic changes.   Electronically Signed   By: Natasha MeadLiviu  Pop M.D.   On:  10/28/2014 19:55     EKG Interpretation None      MDM   Final diagnoses:  SOB (shortness of breath)  Essential hypertension  Cough  Menorrhagia with regular cycle    Chest x-ray and exam do not suggest congestive heart failure. She is hypertensive. Not anemic. I discussed her blood pressure with her. She is not currently medicated. She did have an episode of anemia and significant congestive heart failure with EF of 20-25. Thiswas in 2012. She states she was seen by a cardiologist in [place] 4 months ago and had a "normal echo". She states "he told me I didn't have congestive heart failure anymore, so I stopped those medications".  Not currently anemic. At 48th think she is risk for thromboembolic disease on hormones, thus I deferred this discussion for primary care physician. If her bleeding continues and asked her to follow-up as well. Tessalon for her cough. Zestril for her blood pressure. Recheck with her physician within the next 10 days for blood pressure check.    Rolland Porter, MD 10/28/14 [id number]

## 2014-12-06 ENCOUNTER — Encounter (HOSPITAL_COMMUNITY): Payer: Self-pay | Admitting: Emergency Medicine

## 2014-12-06 ENCOUNTER — Emergency Department (HOSPITAL_COMMUNITY)
Admission: EM | Admit: 2014-12-06 | Discharge: 2014-12-06 | Disposition: A | Discharge status: Home / Self Care | Payer: PRIVATE HEALTH INSURANCE | Attending: Emergency Medicine | Admitting: Emergency Medicine

## 2014-12-06 DIAGNOSIS — Z7952 Long term (current) use of systemic steroids: Secondary | ICD-10-CM | POA: Insufficient documentation

## 2014-12-06 DIAGNOSIS — M79662 Pain in left lower leg: Secondary | ICD-10-CM | POA: Insufficient documentation

## 2014-12-06 DIAGNOSIS — E669 Obesity, unspecified: Secondary | ICD-10-CM | POA: Insufficient documentation

## 2014-12-06 DIAGNOSIS — Z792 Long term (current) use of antibiotics: Secondary | ICD-10-CM | POA: Diagnosis not present

## 2014-12-06 DIAGNOSIS — R0602 Shortness of breath: Secondary | ICD-10-CM | POA: Diagnosis not present

## 2014-12-06 DIAGNOSIS — G8929 Other chronic pain: Secondary | ICD-10-CM | POA: Insufficient documentation

## 2014-12-06 DIAGNOSIS — Z7951 Long term (current) use of inhaled steroids: Secondary | ICD-10-CM | POA: Diagnosis not present

## 2014-12-06 DIAGNOSIS — I10 Essential (primary) hypertension: Secondary | ICD-10-CM | POA: Diagnosis not present

## 2014-12-06 DIAGNOSIS — I509 Heart failure, unspecified: Secondary | ICD-10-CM | POA: Insufficient documentation

## 2014-12-06 DIAGNOSIS — M79605 Pain in left leg: Secondary | ICD-10-CM

## 2014-12-06 DIAGNOSIS — Z862 Personal history of diseases of the blood and blood-forming organs and certain disorders involving the immune mechanism: Secondary | ICD-10-CM | POA: Insufficient documentation

## 2014-12-06 DIAGNOSIS — M542 Cervicalgia: Secondary | ICD-10-CM | POA: Diagnosis not present

## 2014-12-06 DIAGNOSIS — Z7982 Long term (current) use of aspirin: Secondary | ICD-10-CM | POA: Diagnosis not present

## 2014-12-06 HISTORY — DX: Cervicalgia: M54.2

## 2014-12-06 HISTORY — DX: Paresthesia of skin: R20.2

## 2014-12-06 HISTORY — DX: Other chronic pain: G89.29

## 2014-12-06 MED ORDER — KETOROLAC TROMETHAMINE 10 MG PO TABS
10.0000 mg | ORAL_TABLET | Freq: Once | ORAL | Status: AC
  Administered 2014-12-06: 10 mg via ORAL
  Filled 2014-12-06: qty 1

## 2014-12-06 MED ORDER — ONDANSETRON HCL 4 MG PO TABS
4.0000 mg | ORAL_TABLET | Freq: Once | ORAL | Status: AC
  Administered 2014-12-06: 4 mg via ORAL
  Filled 2014-12-06: qty 1

## 2014-12-06 MED ORDER — HYDROCODONE-ACETAMINOPHEN 5-325 MG PO TABS
2.0000 | ORAL_TABLET | Freq: Once | ORAL | Status: AC
  Administered 2014-12-06: 2 via ORAL
  Filled 2014-12-06: qty 2

## 2014-12-06 MED ORDER — HYDROCODONE-ACETAMINOPHEN 5-325 MG PO TABS: 1.0000 | ORAL_TABLET | ORAL | Status: DC | PRN

## 2014-12-06 NOTE — ED Notes (Signed)
Patient c/o left shin pain and swelling. Patient denies any known injury. Per patient feels like pressure with walking. Patient states that she is unable to sleep due to the pain.

## 2014-12-06 NOTE — ED Provider Notes (Signed)
CSN: [id number]     Arrival date & time 12/06/14  1434 History   First MD Initiated Contact with Patient 12/06/14 1501     Chief Complaint  Patient presents with  . Leg Pain     (Consider location/radiation/quality/duration/timing/severity/associated sxs/prior Treatment) HPI Comments: Patient is 48 year old female who presents to the emergency department with "left shin pain".  The patient states that approximately 2 weeks ago she began to have pain in her entire left lower leg. The pain is more along the left shin per the patient. She has not had any recent injury or trauma to the area. She states that the pain seems to be getting progressively worse. She has tried over-the-counter medications without any improvement. Her family members report that she has been doing more standing stretching cleaning and playing with her grandchildren than usual. No previous history of injury or trauma to the shin area. No recent changes in medications. No drainage or red streaks noted of the leg. No injury or trauma reported. Patient denies any high fevers recently. She presents now for evaluation and assistance with this problem.  The history is provided by the patient.    Past Medical History  Diagnosis Date  . Hypertension   . Obesity   . Blood transfusion without reported diagnosis   . Anemia   . CHF (congestive heart failure)   . Chronic neck pain   . Paresthesias    Past Surgical History  Procedure Laterality Date  . Cesarean section     Family History  Problem Relation Age of Onset  . Cancer Mother   . Heart attack Father   . Cancer Brother    History  Substance Use Topics  . Smoking status: Never Smoker   . Smokeless tobacco: Never Used  . Alcohol Use: No   OB History    Gravida Para Term Preterm AB TAB SAB Ectopic Multiple Living   3 3 3       3      Review of Systems  Constitutional: Negative for activity change.       All ROS Neg except as noted in HPI  HENT: Negative  for nosebleeds.   Eyes: Negative for photophobia and discharge.  Respiratory: Positive for shortness of breath. Negative for cough and wheezing.   Cardiovascular: Negative for chest pain and palpitations.  Gastrointestinal: Negative for abdominal pain and blood in stool.  Genitourinary: Negative for dysuria, frequency and hematuria.  Musculoskeletal: Positive for neck pain. Negative for back pain and arthralgias.  Skin: Negative.   Neurological: Negative for dizziness, seizures and speech difficulty.  Psychiatric/Behavioral: Negative for hallucinations and confusion.      Allergies  Review of patient's allergies indicates no known allergies.  Home Medications   Prior to Admission medications   Medication Sig Start Date End Date Taking? Authorizing Provider  albuterol-ipratropium (COMBIVENT) 18-103 MCG/ACT inhaler Inhale 2 puffs into the lungs every 6 (six) hours as needed for shortness of breath. 10/22/12   Kathie DikeHobson M Jodi Criscuolo, PA-C  aspirin 81 MG chewable tablet Chew 81 mg by mouth 2 (two) times daily.     Historical Provider, MD  azithromycin (ZITHROMAX) 250 MG tablet Take first 2 tablets today, then 1 every day until finished. 09/22/14   John L Molpus, MD  benzonatate (TESSALON) 100 MG capsule Take 1 capsule (100 mg total) by mouth every 8 (eight) hours. 10/28/14   Rolland PorterMark James, MD  carvedilol (COREG) 6.25 MG tablet Take 6.25 mg by mouth 2 (two) times daily.  Historical Provider, MD  chlorpheniramine-HYDROcodone (TUSSIONEX PENNKINETIC ER) 10-8 MG/5ML LQCR Take 5 mLs by mouth every 12 (twelve) hours as needed for cough. Patient not taking: Reported on 10/28/2014 09/22/14   Carlisle Beers Molpus, MD  doxycycline (VIBRAMYCIN) 100 MG capsule Take 1 capsule (100 mg total) by mouth 2 (two) times daily. 10/24/14   Juliet Rude. Pickering, MD  fluticasone (FLONASE) 50 MCG/ACT nasal spray Inhale 1 spray into the lungs daily. 03/07/14   Historical Provider, MD  furosemide (LASIX) 40 MG tablet Take 40 mg by mouth  daily as needed for fluid.     Historical Provider, MD  lisinopril (PRINIVIL,ZESTRIL) 20 MG tablet 1 po q day 10/28/14   Rolland Porter, MD  potassium chloride SA (K-DUR,KLOR-CON) 20 MEQ tablet Take 20 mEq by mouth daily as needed. *Take one tablet by  Mouth only on days that Lasix (Furosemide) is taken    Historical Provider, MD  predniSONE (DELTASONE) 20 MG tablet Take 2 tablets (40 mg total) by mouth daily. 10/24/14   Juliet Rude. Pickering, MD  topiramate (TOPAMAX) 50 MG tablet Take 50 mg by mouth 2 (two) times daily. 03/11/14   Historical Provider, MD   BP 159/103 mmHg  Pulse 104  Temp(Src) 98 F (36.7 C) (Oral)  Resp 16  SpO2 99%  LMP 11/18/2014 Physical Exam  Constitutional: She is oriented to person, place, and time. She appears well-developed and well-nourished.  Non-toxic appearance.  HENT:  Head: Normocephalic.  Right Ear: Tympanic membrane and external ear normal.  Left Ear: Tympanic membrane and external ear normal.  Eyes: EOM and lids are normal. Pupils are equal, round, and reactive to light.  Neck: Normal range of motion. Neck supple. Carotid bruit is not present.  Cardiovascular: Normal rate, regular rhythm, normal heart sounds, intact distal pulses and normal pulses.   Pulmonary/Chest: Breath sounds normal. No respiratory distress.  Abdominal: Soft. Bowel sounds are normal. There is no tenderness. There is no guarding.  Musculoskeletal: Normal range of motion.  Patient was amateur minimal problem. There no temperature changes noted of the left lower extremity. There no red streaks appreciated. No broken skin areas appreciated. There is calf pain to palpation, there is anterior lower extremity pain, and there is a positive Homans sign. No red streaks appreciated. There are verrucous veins noted.  Lymphadenopathy:       Head (right side): No submandibular adenopathy present.       Head (left side): No submandibular adenopathy present.    She has no cervical adenopathy.   Neurological: She is alert and oriented to person, place, and time. She has normal strength. No cranial nerve deficit or sensory deficit.  Skin: Skin is warm and dry.  Psychiatric: She has a normal mood and affect. Her speech is normal.  Nursing note and vitals reviewed.   ED Course  Procedures (including critical care time) Labs Review Labs Reviewed - No data to display  Imaging Review No results found.   EKG Interpretation None      MDM  No temperature elevation or wrist streaks or abscess noted. Doubt infectious process. Dorsalis pedis and posterior tibial pulses are 2+, capillary refill is less than 2 seconds, doubt vascular related issue. No palpable deformity appreciated, doubt trauma issue. Patient will have a Doppler study done to rule out deep vein thrombosis. Patient will also be observed for musculoskeletal related pain.  Patient is scheduled for Doppler ultrasound on December 20, at 9 AM. Prescription for [facility] given to the patient.  Final diagnoses:  Pain of left lower extremity    *I have reviewed nursing notes, vital signs, and all appropriate lab and imaging results for this patient.**    Kathie Dike, PA-C 12/06/14 1716  Layla Maw Ward, DO 12/06/14 1800

## 2014-12-06 NOTE — Discharge Instructions (Signed)
Please keep your leg elevated above your waist. Please come to the Emergency Dept at 8:30AM for  registration for your ultrasound of the lower extremity. Please do not use any medications related to ibuprofen tonight or tomorrow. Use tylenol for mild pain. Use norco for more severe pain. Please come to the Emergency Dept Waiting Room for your test results.

## 2014-12-07 ENCOUNTER — Ambulatory Visit (HOSPITAL_COMMUNITY)
Admit: 2014-12-07 | Discharge: 2014-12-07 | Disposition: A | Discharge status: Home / Self Care | Payer: PRIVATE HEALTH INSURANCE | Source: Ambulatory Visit | Attending: Emergency Medicine | Admitting: Emergency Medicine

## 2014-12-07 DIAGNOSIS — M79662 Pain in left lower leg: Secondary | ICD-10-CM | POA: Diagnosis present

## 2014-12-07 DIAGNOSIS — M25562 Pain in left knee: Secondary | ICD-10-CM | POA: Insufficient documentation

## 2014-12-07 NOTE — ED Provider Notes (Signed)
Pt is a 49y/o female who presented to ED on 12/19 for left leg pain. Venous ultrasound obtained and found to be negative today. Test results reviewed with pt. Pt will follow up with PCP.   Kathie Dike, PA-C 12/07/14 1048  Donnetta Hutching, MD 12/10/14 [phone]

## 2015-08-29 ENCOUNTER — Emergency Department (HOSPITAL_COMMUNITY)
Admission: EM | Admit: 2015-08-29 | Discharge: 2015-08-29 | Disposition: A | Discharge status: Home / Self Care | Payer: PRIVATE HEALTH INSURANCE | Attending: Emergency Medicine | Admitting: Emergency Medicine

## 2015-08-29 ENCOUNTER — Emergency Department (HOSPITAL_COMMUNITY): Payer: PRIVATE HEALTH INSURANCE

## 2015-08-29 ENCOUNTER — Encounter (HOSPITAL_COMMUNITY): Payer: Self-pay | Admitting: *Deleted

## 2015-08-29 DIAGNOSIS — Z7982 Long term (current) use of aspirin: Secondary | ICD-10-CM | POA: Insufficient documentation

## 2015-08-29 DIAGNOSIS — Z7952 Long term (current) use of systemic steroids: Secondary | ICD-10-CM | POA: Insufficient documentation

## 2015-08-29 DIAGNOSIS — I10 Essential (primary) hypertension: Secondary | ICD-10-CM | POA: Insufficient documentation

## 2015-08-29 DIAGNOSIS — Z7951 Long term (current) use of inhaled steroids: Secondary | ICD-10-CM | POA: Insufficient documentation

## 2015-08-29 DIAGNOSIS — G8929 Other chronic pain: Secondary | ICD-10-CM | POA: Insufficient documentation

## 2015-08-29 DIAGNOSIS — R059 Cough, unspecified: Secondary | ICD-10-CM

## 2015-08-29 DIAGNOSIS — E669 Obesity, unspecified: Secondary | ICD-10-CM | POA: Insufficient documentation

## 2015-08-29 DIAGNOSIS — R05 Cough: Secondary | ICD-10-CM | POA: Insufficient documentation

## 2015-08-29 DIAGNOSIS — Z79899 Other long term (current) drug therapy: Secondary | ICD-10-CM | POA: Insufficient documentation

## 2015-08-29 DIAGNOSIS — I509 Heart failure, unspecified: Secondary | ICD-10-CM | POA: Insufficient documentation

## 2015-08-29 DIAGNOSIS — Z862 Personal history of diseases of the blood and blood-forming organs and certain disorders involving the immune mechanism: Secondary | ICD-10-CM | POA: Insufficient documentation

## 2015-08-29 MED ORDER — PREDNISONE 50 MG PO TABS: 50.0000 mg | ORAL_TABLET | Freq: Every day | ORAL | Status: DC

## 2015-08-29 MED ORDER — FUROSEMIDE 40 MG PO TABS
80.0000 mg | ORAL_TABLET | Freq: Once | ORAL | Status: AC
  Administered 2015-08-29: 80 mg via ORAL
  Filled 2015-08-29: qty 2

## 2015-08-29 MED ORDER — HYDROCODONE-ACETAMINOPHEN 5-325 MG PO TABS: 1.0000 | ORAL_TABLET | ORAL | Status: DC | PRN

## 2015-08-29 MED ORDER — PREDNISONE 50 MG PO TABS
60.0000 mg | ORAL_TABLET | Freq: Once | ORAL | Status: AC
  Administered 2015-08-29: 60 mg via ORAL
  Filled 2015-08-29 (×2): qty 1

## 2015-08-29 NOTE — ED Notes (Signed)
Patient verbalizes understanding of discharge instructions, prescription medications, home care and follow up care. Patient ambulatory out of department at this time with family member. 

## 2015-08-29 NOTE — ED Notes (Signed)
Pt states she has had a cough for months. Pt states when she coughs, her head and chest hurt.

## 2015-08-29 NOTE — ED Provider Notes (Signed)
CSN: [id number]     Arrival date & time 08/29/15  0255 History   First MD Initiated Contact with Patient 08/29/15 0541     Chief Complaint  Patient presents with  . Cough     (Consider location/radiation/quality/duration/timing/severity/associated sxs/prior Treatment) Patient is a 49 y.o. female presenting with cough. The history is provided by the patient.  Cough She has had a nonproductive cough for about the last 2 months. It has got significantly worse over the last 2 days. It is worse at night and keeps her from sleeping. She denies fever, chills, sweats. She denies dyspnea. There is soreness in her chest from coughing but no actual chest pain. She has had problems with similar cough in the past. Sometimes, cough has been related to fluid buildup block but she states her legs are not any more swollen today normally are. She does have an inhaler and has been using that with only slight relief. In the past, she has had good relief from Tussionex and prednisone but has not had any relief from benzonatate.  Past Medical History  Diagnosis Date  . Hypertension   . Obesity   . Blood transfusion without reported diagnosis   . Anemia   . CHF (congestive heart failure)   . Chronic neck pain   . Paresthesias    Past Surgical History  Procedure Laterality Date  . Cesarean section     Family History  Problem Relation Age of Onset  . Cancer Mother   . Heart attack Father   . Cancer Brother    Social History  Substance Use Topics  . Smoking status: Never Smoker   . Smokeless tobacco: Never Used  . Alcohol Use: No   OB History    Gravida Para Term Preterm AB TAB SAB Ectopic Multiple Living   Review of Systems  Respiratory: Positive for cough.   All other systems reviewed and are negative.     Allergies  Review of patient's allergies indicates no known allergies.  Home Medications   Prior to Admission medications   Medication Sig Start Date End Date  Taking? Authorizing Provider  albuterol-ipratropium (COMBIVENT) 18-103 MCG/ACT inhaler Inhale 2 puffs into the lungs every 6 (six) hours as needed for shortness of breath. 10/22/12   Ivery Quale, PA-C  aspirin 81 MG chewable tablet Chew 81 mg by mouth 2 (two) times daily.     Historical Provider, MD  azithromycin (ZITHROMAX) 250 MG tablet Take first 2 tablets today, then 1 every day until finished. 09/22/14   John Molpus, MD  benzonatate (TESSALON) 100 MG capsule Take 1 capsule (100 mg total) by mouth every 8 (eight) hours. 10/28/14   Rolland Porter, MD  carvedilol (COREG) 6.25 MG tablet Take 6.25 mg by mouth 2 (two) times daily.     Historical Provider, MD  chlorpheniramine-HYDROcodone (TUSSIONEX PENNKINETIC ER) 10-8 MG/5ML LQCR Take 5 mLs by mouth every 12 (twelve) hours as needed for cough. Patient not taking: Reported on 10/28/2014 09/22/14   Paula Libra, MD  doxycycline (VIBRAMYCIN) 100 MG capsule Take 1 capsule (100 mg total) by mouth 2 (two) times daily. 10/24/14   Benjiman Core, MD  fluticasone (FLONASE) 50 MCG/ACT nasal spray Inhale 1 spray into the lungs daily. 03/07/14   Historical Provider, MD  furosemide (LASIX) 40 MG tablet Take 40 mg by mouth daily as needed for fluid.     Historical Provider, MD  HYDROcodone-acetaminophen (NORCO/VICODIN)  5-325 MG per tablet Take 1 tablet by mouth every 4 (four) hours as needed. 12/06/14   Ivery Quale, PA-C  lisinopril (PRINIVIL,ZESTRIL) 20 MG tablet 1 po q day 10/28/14   Rolland Porter, MD  potassium chloride SA (K-DUR,KLOR-CON) 20 MEQ tablet Take 20 mEq by mouth daily as needed. *Take one tablet by  Mouth only on days that Lasix (Furosemide) is taken    Historical Provider, MD  predniSONE (DELTASONE) 20 MG tablet Take 2 tablets (40 mg total) by mouth daily. 10/24/14   Benjiman Core, MD  topiramate (TOPAMAX) 50 MG tablet Take 50 mg by mouth 2 (two) times daily. 03/11/14   Historical Provider, MD   BP 174/95 mmHg  Pulse 86  Temp(Src) 97.6 F (36.4 C)  (Oral)  Resp 13  Ht 5\' 3"  (1.6 m)  Wt 295 lb (133.811 kg)  BMI 52.27 kg/m2  SpO2 96%  LMP 08/10/2015 Physical Exam  Nursing note and vitals reviewed.  49 year old female, resting comfortably and in no acute distress. Vital signs are significant for hypertension. Oxygen saturation is 96%, which is normal. Head is normocephalic and atraumatic. PERRLA, EOMI. Oropharynx is clear. Neck is nontender and supple without adenopathy or JVD. Back is nontender and there is no CVA tenderness. Lungs are clear without rales, wheezes, or rhonchi. heart cough is noted. Chest is nontender. Heart has regular rate and rhythm without murmur. Abdomen is soft, flat, nontender without masses or hepatosplenomegaly and peristalsis is normoactive. Extremities have 1-2+ edema, full range of motion is present. Skin is warm and dry without rash. Neurologic: Mental status is normal, cranial nerves are intact, there are no motor or sensory deficits.  ED Course  Procedures (including critical care time)  Imaging Review Dg Chest 2 View  08/29/2015   CLINICAL DATA:  Cough and shortness of breath for a few weeks.  EXAM: CHEST  2 VIEW  COMPARISON:  05/24/2015  FINDINGS: Cardiac enlargement. Normal pulmonary vascularity. No focal airspace disease or consolidation in the lungs. No blunting of costophrenic angles. No pneumothorax. Mediastinal contours appear intact.  IMPRESSION: Cardiac enlargement.  No evidence of active pulmonary disease.   Electronically Signed   By: Burman Nieves M.D.   On: 08/29/2015 03:56   I have personally reviewed and evaluated these images as part of my medical decision-making.   MDM   Final diagnoses:  Cough    Cough without evidence of infection. Chest x-ray shows no pneumonia but does have cardiomegaly. She does have significant edema and CHF could possibly be playing a part in her cough. also, consider possibility of seasonal allergies.  I do not see indications for anabolic Lorin Picket  currently. Old records are reviewed and she has multiple ED visits for coughing. she is advised to increase her furosemide from 80 mg twice a day to 80 mg 3 times a day for the next 3 days. She is given a prescription for prednisone and is also given a prescription for hydrocodone-acetaminophen to use at night to suppress her cough. She has an appointment with her PCP on September 22 and she is to keep that appointment.  Dione Booze, MD 08/29/15 [phone]

## 2015-08-29 NOTE — Discharge Instructions (Signed)
Take your furosemide (Lasix) three times a day for the next three days.   Cough, Adult  A cough is a reflex that helps clear your throat and airways. It can help heal the body or may be a reaction to an irritated airway. A cough may only last 2 or 3 weeks (acute) or may last more than 8 weeks (chronic).  CAUSES Acute cough:  Viral or bacterial infections. Chronic cough:  Infections.  Allergies.  Asthma.  Post-nasal drip.  Smoking.  Heartburn or acid reflux.  Some medicines.  Chronic lung problems (COPD).  Cancer. SYMPTOMS   Cough.  Fever.  Chest pain.  Increased breathing rate.  High-pitched whistling sound when breathing (wheezing).  Colored mucus that you cough up (sputum). TREATMENT   A bacterial cough may be treated with antibiotic medicine.  A viral cough must run its course and will not respond to antibiotics.  Your caregiver may recommend other treatments if you have a chronic cough. HOME CARE INSTRUCTIONS   Only take over-the-counter or prescription medicines for pain, discomfort, or fever as directed by your caregiver. Use cough suppressants only as directed by your caregiver.  Use a cold steam vaporizer or humidifier in your bedroom or home to help loosen secretions.  Sleep in a semi-upright position if your cough is worse at night.  Rest as needed.  Stop smoking if you smoke. SEEK IMMEDIATE MEDICAL CARE IF:   You have pus in your sputum.  Your cough starts to worsen.  You cannot control your cough with suppressants and are losing sleep.  You begin coughing up blood.  You have difficulty breathing.  You develop pain which is getting worse or is uncontrolled with medicine.  You have a fever. MAKE SURE YOU:   Understand these instructions.  Will watch your condition.  Will get help right away if you are not doing well or get worse. Document Released: 06/03/2011 Document Revised: 02/27/2012 Document Reviewed:  06/03/2011 [facility] Patient Information 2015 [place], [place]. This information is not intended to replace advice given to you by your health care provider. Make sure you discuss any questions you have with your health care provider.  Prednisone tablets What is this medicine? PREDNISONE (PRED ni sone) is a corticosteroid. It is commonly used to treat inflammation of the skin, joints, lungs, and other organs. Common conditions treated include asthma, allergies, and arthritis. It is also used for other conditions, such as blood disorders and diseases of the adrenal glands. This medicine may be used for other purposes; ask your health care provider or pharmacist if you have questions. COMMON BRAND NAME(S): Deltasone, Predone, Sterapred, Sterapred DS What should I tell my health care provider before I take this medicine? They need to know if you have any of these conditions: -Cushing's syndrome -diabetes -glaucoma -heart disease -high blood pressure -infection (especially a virus infection such as chickenpox, cold sores, or herpes) -kidney disease -liver disease -mental illness -myasthenia gravis -osteoporosis -seizures -stomach or intestine problems -thyroid disease -an unusual or allergic reaction to lactose, prednisone, other medicines, foods, dyes, or preservatives -pregnant or trying to get pregnant -breast-feeding How should I use this medicine? Take this medicine by mouth with a glass of water. Follow the directions on the prescription label. Take this medicine with food. If you are taking this medicine once a day, take it in the morning. Do not take more medicine than you are told to take. Do not suddenly stop taking your medicine because you may develop a severe reaction.  Your doctor will tell you how much medicine to take. If your doctor wants you to stop the medicine, the dose may be slowly lowered over time to avoid any side effects. Talk to your pediatrician regarding the use of  this medicine in children. Special care may be needed. Overdosage: If you think you have taken too much of this medicine contact a poison control center or emergency room at once. NOTE: This medicine is only for you. Do not share this medicine with others. What if I miss a dose? If you miss a dose, take it as soon as you can. If it is almost time for your next dose, talk to your doctor or health care professional. You may need to miss a dose or take an extra dose. Do not take double or extra doses without advice. What may interact with this medicine? Do not take this medicine with any of the following medications: -metyrapone -mifepristone This medicine may also interact with the following medications: -aminoglutethimide -amphotericin B -aspirin and aspirin-like medicines -barbiturates -certain medicines for diabetes, like glipizide or glyburide -cholestyramine -cholinesterase inhibitors -cyclosporine -digoxin -diuretics -ephedrine -female hormones, like estrogens and birth control pills -isoniazid -ketoconazole -NSAIDS, medicines for pain and inflammation, like ibuprofen or naproxen -phenytoin -rifampin -toxoids -vaccines -warfarin This list may not describe all possible interactions. Give your health care provider a list of all the medicines, herbs, non-prescription drugs, or dietary supplements you use. Also tell them if you smoke, drink alcohol, or use illegal drugs. Some items may interact with your medicine. What should I watch for while using this medicine? Visit your doctor or health care professional for regular checks on your progress. If you are taking this medicine over a prolonged period, carry an identification card with your name and address, the type and dose of your medicine, and your doctor's name and address. This medicine may increase your risk of getting an infection. Tell your doctor or health care professional if you are around anyone with measles or  chickenpox, or if you develop sores or blisters that do not heal properly. If you are going to have surgery, tell your doctor or health care professional that you have taken this medicine within the last twelve months. Ask your doctor or health care professional about your diet. You may need to lower the amount of salt you eat. This medicine may affect blood sugar levels. If you have diabetes, check with your doctor or health care professional before you change your diet or the dose of your diabetic medicine. What side effects may I notice from receiving this medicine? Side effects that you should report to your doctor or health care professional as soon as possible: -allergic reactions like skin rash, itching or hives, swelling of the face, lips, or tongue -changes in emotions or moods -changes in vision -depressed mood -eye pain -fever or chills, cough, sore throat, pain or difficulty passing urine -increased thirst -swelling of ankles, feet Side effects that usually do not require medical attention (report to your doctor or health care professional if they continue or are bothersome): -confusion, excitement, restlessness -headache -nausea, vomiting -skin problems, acne, thin and shiny skin -trouble sleeping -weight gain This list may not describe all possible side effects. Call your doctor for medical advice about side effects. You may report side effects to FDA at 1-800-FDA-1088. Where should I keep my medicine? Keep out of the reach of children. Store at room temperature between 15 and 30 degrees C (59 and 86 degrees F).  Protect from light. Keep container tightly closed. Throw away any unused medicine after the expiration date. NOTE: This sheet is a summary. It may not cover all possible information. If you have questions about this medicine, talk to your doctor, pharmacist, or health care provider.  2015, Elsevier/Gold Standard. (2011-07-21 10:57:14)  Acetaminophen; Hydrocodone  tablets or capsules What is this medicine? ACETAMINOPHEN; HYDROCODONE (a set a MEE noe fen; hye droe KOE done) is a pain reliever. It is used to treat mild to moderate pain. This medicine may be used for other purposes; ask your health care provider or pharmacist if you have questions. COMMON BRAND NAME(S): Anexsia, Bancap HC, Ceta-Plus, Co-Gesic, Comfortpak, Dolagesic, [place], [address], [address], Hydrogesic, [place], Lorcet HD, Lorcet Plus, Lortab, Margesic H, Maxidone, [place], Polygesic, [place], [place], [employer], Vicodin, Vicodin ES, Vicodin HP, [place] What should I tell my health care provider before I take this medicine? They need to know if you have any of these conditions: -brain tumor -Crohn's disease, inflammatory bowel disease, or ulcerative colitis -drug abuse or addiction -head injury -heart or circulation problems -if you often drink alcohol -kidney disease or problems going to the bathroom -liver disease -lung disease, asthma, or breathing problems -an unusual or allergic reaction to acetaminophen, hydrocodone, other opioid analgesics, other medicines, foods, dyes, or preservatives -pregnant or trying to get pregnant -breast-feeding How should I use this medicine? Take this medicine by mouth. Swallow it with a full glass of water. Follow the directions on the prescription label. If the medicine upsets your stomach, take the medicine with food or milk. Do not take more than you are told to take. Talk to your pediatrician regarding the use of this medicine in children. This medicine is not approved for use in children. Overdosage: If you think you have taken too much of this medicine contact a poison control center or emergency room at once. NOTE: This medicine is only for you. Do not share this medicine with others. What if I miss a dose? If you miss a dose, take it as soon as you can. If it is almost time for your next dose, take only that dose. Do not take double  or extra doses. What may interact with this medicine? -alcohol -antihistamines -isoniazid -medicines for depression, anxiety, or psychotic disturbances -medicines for sleep -muscle relaxants -naltrexone -narcotic medicines (opiates) for pain -phenobarbital -ritonavir -tramadol This list may not describe all possible interactions. Give your health care provider a list of all the medicines, herbs, non-prescription drugs, or dietary supplements you use. Also tell them if you smoke, drink alcohol, or use illegal drugs. Some items may interact with your medicine. What should I watch for while using this medicine? Tell your doctor or health care professional if your pain does not go away, if it gets worse, or if you have new or a different type of pain. You may develop tolerance to the medicine. Tolerance means that you will need a higher dose of the medicine for pain relief. Tolerance is normal and is expected if you take the medicine for a long time. Do not suddenly stop taking your medicine because you may develop a severe reaction. Your body becomes used to the medicine. This does NOT mean you are addicted. Addiction is a behavior related to getting and using a drug for a non-medical reason. If you have pain, you have a medical reason to take pain medicine. Your doctor will tell you how much medicine to take. If your doctor wants you to stop  the medicine, the dose will be slowly lowered over time to avoid any side effects. You may get drowsy or dizzy when you first start taking the medicine or change doses. Do not drive, use machinery, or do anything that may be dangerous until you know how the medicine affects you. Stand or sit up slowly. There are different types of narcotic medicines (opiates) for pain. If you take more than one type at the same time, you may have more side effects. Give your health care provider a list of all medicines you use. Your doctor will tell you how much medicine to take.  Do not take more medicine than directed. Call emergency for help if you have problems breathing. The medicine will cause constipation. Try to have a bowel movement at least every 2 to 3 days. If you do not have a bowel movement for 3 days, call your doctor or health care professional. Too much acetaminophen can be very dangerous. Do not take Tylenol (acetaminophen) or medicines that contain acetaminophen with this medicine. Many non-prescription medicines contain acetaminophen. Always read the labels carefully. What side effects may I notice from receiving this medicine? Side effects that you should report to your doctor or health care professional as soon as possible: -allergic reactions like skin rash, itching or hives, swelling of the face, lips, or tongue -breathing problems -confusion -feeling faint or lightheaded, falls -stomach pain -yellowing of the eyes or skin Side effects that usually do not require medical attention (report to your doctor or health care professional if they continue or are bothersome): -nausea, vomiting -stomach upset This list may not describe all possible side effects. Call your doctor for medical advice about side effects. You may report side effects to FDA at 1-800-FDA-1088. Where should I keep my medicine? Keep out of the reach of children. This medicine can be abused. Keep your medicine in a safe place to protect it from theft. Do not share this medicine with anyone. Selling or giving away this medicine is dangerous and against the law. Store at room temperature between 15 and 30 degrees C (59 and 86 degrees F). Protect from light. Keep container tightly closed. Throw away any unused medicine after the expiration date. Discard unused medicine and used packaging carefully. Pets and children can be harmed if they find used or lost packages. NOTE: This sheet is a summary. It may not cover all possible information. If you have questions about this medicine, talk to  your doctor, pharmacist, or health care provider.  2015, Elsevier/Gold Standard. (2013-07-29 13:15:56)

## 2015-12-10 ENCOUNTER — Emergency Department (HOSPITAL_COMMUNITY): Payer: Self-pay

## 2015-12-10 ENCOUNTER — Emergency Department (HOSPITAL_COMMUNITY)
Admission: EM | Admit: 2015-12-10 | Discharge: 2015-12-10 | Disposition: A | Discharge status: Home / Self Care | Payer: Self-pay | Attending: Emergency Medicine | Admitting: Emergency Medicine

## 2015-12-10 ENCOUNTER — Encounter (HOSPITAL_COMMUNITY): Payer: Self-pay

## 2015-12-10 DIAGNOSIS — G8929 Other chronic pain: Secondary | ICD-10-CM | POA: Insufficient documentation

## 2015-12-10 DIAGNOSIS — Z7951 Long term (current) use of inhaled steroids: Secondary | ICD-10-CM | POA: Insufficient documentation

## 2015-12-10 DIAGNOSIS — M25562 Pain in left knee: Secondary | ICD-10-CM | POA: Insufficient documentation

## 2015-12-10 DIAGNOSIS — I509 Heart failure, unspecified: Secondary | ICD-10-CM | POA: Insufficient documentation

## 2015-12-10 DIAGNOSIS — I10 Essential (primary) hypertension: Secondary | ICD-10-CM | POA: Insufficient documentation

## 2015-12-10 DIAGNOSIS — Z862 Personal history of diseases of the blood and blood-forming organs and certain disorders involving the immune mechanism: Secondary | ICD-10-CM | POA: Insufficient documentation

## 2015-12-10 DIAGNOSIS — Z7982 Long term (current) use of aspirin: Secondary | ICD-10-CM | POA: Insufficient documentation

## 2015-12-10 DIAGNOSIS — Z7952 Long term (current) use of systemic steroids: Secondary | ICD-10-CM | POA: Insufficient documentation

## 2015-12-10 DIAGNOSIS — E669 Obesity, unspecified: Secondary | ICD-10-CM | POA: Insufficient documentation

## 2015-12-10 DIAGNOSIS — Z79899 Other long term (current) drug therapy: Secondary | ICD-10-CM | POA: Insufficient documentation

## 2015-12-10 MED ORDER — NAPROXEN 500 MG PO TABS: 500.0000 mg | ORAL_TABLET | Freq: Two times a day (BID) | ORAL | Status: DC

## 2015-12-10 MED ORDER — OXYCODONE-ACETAMINOPHEN 5-325 MG PO TABS
1.0000 | ORAL_TABLET | Freq: Once | ORAL | Status: AC
  Administered 2015-12-10: 1 via ORAL
  Filled 2015-12-10: qty 1

## 2015-12-10 MED ORDER — IBUPROFEN 800 MG PO TABS
800.0000 mg | ORAL_TABLET | Freq: Once | ORAL | Status: AC
  Administered 2015-12-10: 800 mg via ORAL
  Filled 2015-12-10: qty 1

## 2015-12-10 MED ORDER — OXYCODONE-ACETAMINOPHEN 5-325 MG PO TABS: 1.0000 | ORAL_TABLET | ORAL | Status: DC | PRN

## 2015-12-10 NOTE — ED Notes (Signed)
Pt reports pain to front of left shin x 1 week, denies recent injury or trauma.

## 2015-12-10 NOTE — ED Notes (Signed)
Discharge instructions given, pt demonstrated teach back and verbal understanding. No concerns voiced.  

## 2015-12-10 NOTE — ED Provider Notes (Signed)
CSN: [id number]     Arrival date & time 12/10/15  0256 History   First MD Initiated Contact with Patient 12/10/15 0302     Chief Complaint  Patient presents with  . Leg Pain     (Consider location/radiation/quality/duration/timing/severity/associated sxs/prior Treatment) The history is provided by the patient.   49 year old female comes in complaining of pain in her left lower leg for last week. Pain is located proximally. It is a dull pain which she rates at 9/10. It is worse with walking and worse with exposure to cold, better if she lays in bed with a pillow between her knees. She has taken Tylenol arthritis without relief. She denies any trauma or unusual activity. She had similar episode one year ago and states no diagnosis was ever found.  Past Medical History  Diagnosis Date  . Hypertension   . Obesity   . Blood transfusion without reported diagnosis   . Anemia   . CHF (congestive heart failure)   . Chronic neck pain   . Paresthesias    Past Surgical History  Procedure Laterality Date  . Cesarean section     Family History  Problem Relation Age of Onset  . Cancer Mother   . Heart attack Father   . Cancer Brother    Social History  Substance Use Topics  . Smoking status: Never Smoker   . Smokeless tobacco: Never Used  . Alcohol Use: No   OB History    Gravida Para Term Preterm AB TAB SAB Ectopic Multiple Living   3 3 3       3      Review of Systems  All other systems reviewed and are negative.     Allergies  Review of patient's allergies indicates no known allergies.  Home Medications   Prior to Admission medications   Medication Sig Start Date End Date Taking? Authorizing Provider  albuterol-ipratropium (COMBIVENT) 18-103 MCG/ACT inhaler Inhale 2 puffs into the lungs every 6 (six) hours as needed for shortness of breath. 10/22/12   Ivery Quale, PA-C  aspirin 81 MG chewable tablet Chew 81 mg by mouth 2 (two) times daily.     Historical Provider, MD   carvedilol (COREG) 6.25 MG tablet Take 6.25 mg by mouth 2 (two) times daily.     Historical Provider, MD  chlorpheniramine-HYDROcodone (TUSSIONEX PENNKINETIC ER) 10-8 MG/5ML LQCR Take 5 mLs by mouth every 12 (twelve) hours as needed for cough. Patient not taking: Reported on 10/28/2014 09/22/14   Paula Libra, MD  fluticasone [facility]) 50 MCG/ACT nasal spray Inhale 1 spray into the lungs daily. 03/07/14   Historical Provider, MD  furosemide (LASIX) 40 MG tablet Take 40 mg by mouth daily as needed for fluid.     Historical Provider, MD  HYDROcodone-acetaminophen (NORCO/VICODIN) 5-325 MG per tablet Take 1 tablet by mouth every 4 (four) hours as needed for moderate pain (or coughing). 08/29/15   Dione Booze, MD  lisinopril (PRINIVIL,ZESTRIL) 20 MG tablet 1 po q day 10/28/14   Rolland Porter, MD  potassium chloride SA (K-DUR,KLOR-CON) 20 MEQ tablet Take 20 mEq by mouth daily as needed. *Take one tablet by  Mouth only on days that Lasix (Furosemide) is taken    Historical Provider, MD  predniSONE (DELTASONE) 50 MG tablet Take 1 tablet (50 mg total) by mouth daily. 08/29/15   Dione Booze, MD  topiramate (TOPAMAX) 50 MG tablet Take 50 mg by mouth 2 (two) times daily. 03/11/14   Historical Provider, MD   BP 156/83  mmHg  Pulse 83  Temp(Src) 97.9 F (36.6 C) (Oral)  Resp 20  Ht  (1.6 m)  Wt 302 lb (136.986 kg)  BMI 53.51 kg/m2  SpO2 98%  LMP 11/30/2015 Physical Exam  Nursing note and vitals reviewed.  49 year old female, resting comfortably and in no acute distress. Vital signs are significant for hypertension. Oxygen saturation is 98%, which is normal. Head is normocephalic and atraumatic. PERRLA, EOMI. Oropharynx is clear. Neck is nontender and supple without adenopathy or JVD. Back is nontender and there is no CVA tenderness. Lungs are clear without rales, wheezes, or rhonchi. Chest is nontender. Heart has regular rate and rhythm without murmur. Abdomen is soft, flat, nontender without masses or  hepatosplenomegaly and peristalsis is normoactive. Extremities: There is tenderness to palpation in the left knee medially and laterally. There is pain on varus stress but not on valgus stress. No instability is noted. Lachman and McMurray's tests are negative. There is also mild tenderness over the proximal left lower leg, just distal to the knee joint but maximum pain is around the knee. Straight leg raise is negative. Exam of the right knee is normal and there is a negative straight leg raise on the right. 1+ pitting edema is present. Skin is warm and dry without rash. Neurologic: Mental status is normal, cranial nerves are intact, there are no motor or sensory deficits.   ED Course  Procedures (including critical care time)  Imaging Review Dg Knee Complete 4 Views Left  12/10/2015  CLINICAL DATA:  49 year old female with left knee pain. No known injury. EXAM: LEFT KNEE - COMPLETE 4+ VIEW COMPARISON:  None. FINDINGS: There is no evidence of fracture, dislocation, or joint effusion. There is no evidence of arthropathy or other focal bone abnormality. Soft tissues are unremarkable. IMPRESSION: Negative. Electronically Signed   By: Elgie Collard M.D.   On: 12/10/2015 03:51   I have personally reviewed and evaluated these images as part of my medical decision-making.   MDM   Final diagnoses:  Pain in left knee    Left leg pain which appears to be arthritis in the left knee. She will be sent for x-rays. Old records are reviewed and she was seen one year ago for left lower leg pain which time she had a negative venous Doppler. I have reviewed her labs and she has no renal disease. She had been on warfarin, but had been taken off of it and she is not currently on any anticoagulants. She is given a dose of ibuprofen and oxycodone-acetaminophen.  She had good relief of pain with above noted treatment. X-rays actually show no significant arthritic changes. She is placed in a knee immobilizer for  comfort and given crutches to use as needed and is discharged with prescriptions for naproxen and oxycodone-acetaminophen.  Dione Booze, MD 12/10/15 [phone]

## 2015-12-10 NOTE — Discharge Instructions (Signed)
Use the immobilizer and crutches as needed.   Joint Pain Joint pain, which is also called arthralgia, can be caused by many things. Joint pain often goes away when you follow your health care provider's instructions for relieving pain at home. However, joint pain can also be caused by conditions that require further treatment. Common causes of joint pain include:  Bruising in the area of the joint.  Overuse of the joint.  Wear and tear on the joints that occur with aging (osteoarthritis).  Various other forms of arthritis.  A buildup of a crystal form of uric acid in the joint (gout).  Infections of the joint (septic arthritis) or of the bone (osteomyelitis). Your health care provider may recommend medicine to help with the pain. If your joint pain continues, additional tests may be needed to diagnose your condition. HOME CARE INSTRUCTIONS Watch your condition for any changes. Follow these instructions as directed to lessen the pain that you are feeling.  Take medicines only as directed by your health care provider.  Rest the affected area for as long as your health care provider says that you should. If directed to do so, raise the painful joint above the level of your heart while you are sitting or lying down.  Do not do things that cause or worsen pain.  If directed, apply ice to the painful area:  Put ice in a plastic bag.  Place a towel between your skin and the bag.  Leave the ice on for 20 minutes, 2-3 times per day.  Wear an elastic bandage, splint, or sling as directed by your health care provider. Loosen the elastic bandage or splint if your fingers or toes become numb and tingle, or if they turn cold and blue.  Begin exercising or stretching the affected area as directed by your health care provider. Ask your health care provider what types of exercise are safe for you.  Keep all follow-up visits as directed by your health care provider. This is important. SEEK  MEDICAL CARE IF:  Your pain increases, and medicine does not help.  Your joint pain does not improve within 3 days.  You have increased bruising or swelling.  You have a fever.  You lose 10 lb (4.5 kg) or more without trying. SEEK IMMEDIATE MEDICAL CARE IF:  You are not able to move the joint.  Your fingers or toes become numb or they turn cold and blue.   This information is not intended to replace advice given to you by your health care provider. Make sure you discuss any questions you have with your health care provider.   Document Released: 12/05/2005 Document Revised: 12/26/2014 Document Reviewed: 09/16/2014 Elsevier Interactive Patient Education 2016 [employer]  Naproxen and naproxen sodium oral immediate-release tablets What is this medicine? NAPROXEN (na PROX en) is a non-steroidal anti-inflammatory drug (NSAID). It is used to reduce swelling and to treat pain. This medicine may be used for dental pain, headache, or painful monthly periods. It is also used for painful joint and muscular problems such as arthritis, tendinitis, bursitis, and gout. This medicine may be used for other purposes; ask your health care provider or pharmacist if you have questions. What should I tell my health care provider before I take this medicine? They need to know if you have any of these conditions: -asthma -cigarette smoker -drink more than 3 alcohol containing drinks a day -heart disease or circulation problems such as heart failure or leg edema (fluid retention) -high blood  pressure -kidney disease -liver disease -stomach bleeding or ulcers -an unusual or allergic reaction to naproxen, aspirin, other NSAIDs, other medicines, foods, dyes, or preservatives -pregnant or trying to get pregnant -breast-feeding How should I use this medicine? Take this medicine by mouth with a glass of water. Follow the directions on the prescription label. Take it with food if your stomach gets upset.  Try to not lie down for at least 10 minutes after you take it. Take your medicine at regular intervals. Do not take your medicine more often than directed. Long-term, continuous use may increase the risk of heart attack or stroke. A special MedGuide will be given to you by the pharmacist with each prescription and refill. Be sure to read this information carefully each time. Talk to your pediatrician regarding the use of this medicine in children. Special care may be needed. Overdosage: If you think you have taken too much of this medicine contact a poison control center or emergency room at once. NOTE: This medicine is only for you. Do not share this medicine with others. What if I miss a dose? If you miss a dose, take it as soon as you can. If it is almost time for your next dose, take only that dose. Do not take double or extra doses. What may interact with this medicine? -alcohol -aspirin -cidofovir -diuretics -lithium -methotrexate -other drugs for inflammation like ketorolac or prednisone -pemetrexed -probenecid -warfarin This list may not describe all possible interactions. Give your health care provider a list of all the medicines, herbs, non-prescription drugs, or dietary supplements you use. Also tell them if you smoke, drink alcohol, or use illegal drugs. Some items may interact with your medicine. What should I watch for while using this medicine? Tell your doctor or health care professional if your pain does not get better. Talk to your doctor before taking another medicine for pain. Do not treat yourself. This medicine does not prevent heart attack or stroke. In fact, this medicine may increase the chance of a heart attack or stroke. The chance may increase with longer use of this medicine and in people who have heart disease. If you take aspirin to prevent heart attack or stroke, talk with your doctor or health care professional. Do not take other medicines that contain aspirin,  ibuprofen, or naproxen with this medicine. Side effects such as stomach upset, nausea, or ulcers may be more likely to occur. Many medicines available without a prescription should not be taken with this medicine. This medicine can cause ulcers and bleeding in the stomach and intestines at any time during treatment. Do not smoke cigarettes or drink alcohol. These increase irritation to your stomach and can make it more susceptible to damage from this medicine. Ulcers and bleeding can happen without warning symptoms and can cause death. You may get drowsy or dizzy. Do not drive, use machinery, or do anything that needs mental alertness until you know how this medicine affects you. Do not stand or sit up quickly, especially if you are an older patient. This reduces the risk of dizzy or fainting spells. This medicine can cause you to bleed more easily. Try to avoid damage to your teeth and gums when you brush or floss your teeth. What side effects may I notice from receiving this medicine? Side effects that you should report to your doctor or health care professional as soon as possible: -black or bloody stools, blood in the urine or vomit -blurred vision -chest pain -difficulty breathing or  wheezing -nausea or vomiting -severe stomach pain -skin rash, skin redness, blistering or peeling skin, hives, or itching -slurred speech or weakness on one side of the body -swelling of eyelids, throat, lips -unexplained weight gain or swelling -unusually weak or tired -yellowing of eyes or skin Side effects that usually do not require medical attention (report to your doctor or health care professional if they continue or are bothersome): -constipation -headache -heartburn This list may not describe all possible side effects. Call your doctor for medical advice about side effects. You may report side effects to FDA at 1-800-FDA-1088. Where should I keep my medicine? Keep out of the reach of  children. Store at room temperature between 15 and 30 degrees C (59 and 86 degrees F). Keep container tightly closed. Throw away any unused medicine after the expiration date. NOTE: This sheet is a summary. It may not cover all possible information. If you have questions about this medicine, talk to your doctor, pharmacist, or health care provider.    2016, Elsevier/Gold Standard. (2009-12-07 20:10:16)  Acetaminophen; Oxycodone tablets What is this medicine? ACETAMINOPHEN; OXYCODONE (a set a MEE noe fen; ox i KOE done) is a pain reliever. It is used to treat moderate to severe pain. This medicine may be used for other purposes; ask your health care provider or pharmacist if you have questions. What should I tell my health care provider before I take this medicine? They need to know if you have any of these conditions: -brain tumor -Crohn's disease, inflammatory bowel disease, or ulcerative colitis -drug abuse or addiction -head injury -heart or circulation problems -if you often drink alcohol -kidney disease or problems going to the bathroom -liver disease -lung disease, asthma, or breathing problems -an unusual or allergic reaction to acetaminophen, oxycodone, other opioid analgesics, other medicines, foods, dyes, or preservatives -pregnant or trying to get pregnant -breast-feeding How should I use this medicine? Take this medicine by mouth with a full glass of water. Follow the directions on the prescription label. You can take it with or without food. If it upsets your stomach, take it with food. Take your medicine at regular intervals. Do not take it more often than directed. Talk to your pediatrician regarding the use of this medicine in children. Special care may be needed. Patients over 80 years old may have a stronger reaction and need a smaller dose. Overdosage: If you think you have taken too much of this medicine contact a poison control center or emergency room at  once. NOTE: This medicine is only for you. Do not share this medicine with others. What if I miss a dose? If you miss a dose, take it as soon as you can. If it is almost time for your next dose, take only that dose. Do not take double or extra doses. What may interact with this medicine? -alcohol -antihistamines -barbiturates like amobarbital, butalbital, butabarbital, methohexital, pentobarbital, phenobarbital, thiopental, and secobarbital -benztropine -drugs for bladder problems like solifenacin, trospium, oxybutynin, tolterodine, hyoscyamine, and methscopolamine -drugs for breathing problems like ipratropium and tiotropium -drugs for certain stomach or intestine problems like propantheline, homatropine methylbromide, glycopyrrolate, atropine, belladonna, and dicyclomine -general anesthetics like etomidate, ketamine, nitrous oxide, propofol, desflurane, enflurane, halothane, isoflurane, and sevoflurane -medicines for depression, anxiety, or psychotic disturbances -medicines for sleep -muscle relaxants -naltrexone -narcotic medicines (opiates) for pain -phenothiazines like perphenazine, thioridazine, chlorpromazine, mesoridazine, fluphenazine, prochlorperazine, promazine, and trifluoperazine -scopolamine -tramadol -trihexyphenidyl This list may not describe all possible interactions. Give your health care provider a list of  all the medicines, herbs, non-prescription drugs, or dietary supplements you use. Also tell them if you smoke, drink alcohol, or use illegal drugs. Some items may interact with your medicine. What should I watch for while using this medicine? Tell your doctor or health care professional if your pain does not go away, if it gets worse, or if you have new or a different type of pain. You may develop tolerance to the medicine. Tolerance means that you will need a higher dose of the medication for pain relief. Tolerance is normal and is expected if you take this medicine for  a long time. Do not suddenly stop taking your medicine because you may develop a severe reaction. Your body becomes used to the medicine. This does NOT mean you are addicted. Addiction is a behavior related to getting and using a drug for a non-medical reason. If you have pain, you have a medical reason to take pain medicine. Your doctor will tell you how much medicine to take. If your doctor wants you to stop the medicine, the dose will be slowly lowered over time to avoid any side effects. You may get drowsy or dizzy. Do not drive, use machinery, or do anything that needs mental alertness until you know how this medicine affects you. Do not stand or sit up quickly, especially if you are an older patient. This reduces the risk of dizzy or fainting spells. Alcohol may interfere with the effect of this medicine. Avoid alcoholic drinks. There are different types of narcotic medicines (opiates) for pain. If you take more than one type at the same time, you may have more side effects. Give your health care provider a list of all medicines you use. Your doctor will tell you how much medicine to take. Do not take more medicine than directed. Call emergency for help if you have problems breathing. The medicine will cause constipation. Try to have a bowel movement at least every 2 to 3 days. If you do not have a bowel movement for 3 days, call your doctor or health care professional. Do not take Tylenol (acetaminophen) or medicines that have acetaminophen with this medicine. Too much acetaminophen can be very dangerous. Many nonprescription medicines contain acetaminophen. Always read the labels carefully to avoid taking more acetaminophen. What side effects may I notice from receiving this medicine? Side effects that you should report to your doctor or health care professional as soon as possible: -allergic reactions like skin rash, itching or hives, swelling of the face, lips, or tongue -breathing difficulties,  wheezing -confusion -light headedness or fainting spells -severe stomach pain -unusually weak or tired -yellowing of the skin or the whites of the eyes Side effects that usually do not require medical attention (report to your doctor or health care professional if they continue or are bothersome): -dizziness -drowsiness -nausea -vomiting This list may not describe all possible side effects. Call your doctor for medical advice about side effects. You may report side effects to FDA at 1-800-FDA-1088. Where should I keep my medicine? Keep out of the reach of children. This medicine can be abused. Keep your medicine in a safe place to protect it from theft. Do not share this medicine with anyone. Selling or giving away this medicine is dangerous and against the law. This medicine may cause accidental overdose and death if it taken by other adults, children, or pets. Mix any unused medicine with a substance like cat litter or coffee grounds. Then throw the medicine away in a  sealed container like a sealed bag or a coffee can with a lid. Do not use the medicine after the expiration date. Store at room temperature between 20 and 25 degrees C (68 and 77 degrees F). NOTE: This sheet is a summary. It may not cover all possible information. If you have questions about this medicine, talk to your doctor, pharmacist, or health care provider.    2016, Elsevier/Gold Standard. (2014-11-05 15:18:46)

## 2016-03-20 ENCOUNTER — Emergency Department (HOSPITAL_COMMUNITY): Payer: Self-pay

## 2016-03-20 ENCOUNTER — Emergency Department (HOSPITAL_COMMUNITY)
Admission: EM | Admit: 2016-03-20 | Discharge: 2016-03-20 | Disposition: A | Discharge status: Home / Self Care | Payer: Self-pay | Attending: Emergency Medicine | Admitting: Emergency Medicine

## 2016-03-20 ENCOUNTER — Encounter (HOSPITAL_COMMUNITY): Payer: Self-pay | Admitting: *Deleted

## 2016-03-20 DIAGNOSIS — E669 Obesity, unspecified: Secondary | ICD-10-CM | POA: Insufficient documentation

## 2016-03-20 DIAGNOSIS — Z79899 Other long term (current) drug therapy: Secondary | ICD-10-CM | POA: Insufficient documentation

## 2016-03-20 DIAGNOSIS — I110 Hypertensive heart disease with heart failure: Secondary | ICD-10-CM | POA: Insufficient documentation

## 2016-03-20 DIAGNOSIS — J069 Acute upper respiratory infection, unspecified: Secondary | ICD-10-CM | POA: Insufficient documentation

## 2016-03-20 DIAGNOSIS — Z7982 Long term (current) use of aspirin: Secondary | ICD-10-CM | POA: Insufficient documentation

## 2016-03-20 DIAGNOSIS — I509 Heart failure, unspecified: Secondary | ICD-10-CM | POA: Insufficient documentation

## 2016-03-20 MED ORDER — ALBUTEROL SULFATE HFA 108 (90 BASE) MCG/ACT IN AERS
2.0000 | INHALATION_SPRAY | RESPIRATORY_TRACT | Status: DC
  Administered 2016-03-20: 2 via RESPIRATORY_TRACT
  Filled 2016-03-20: qty 6.7

## 2016-03-20 NOTE — Discharge Instructions (Signed)
Upper Respiratory Infection, Adult Most upper respiratory infections (URIs) are a viral infection of the air passages leading to the lungs. A URI affects the nose, throat, and upper air passages. The most common type of URI is nasopharyngitis and is typically referred to as "the common cold." URIs run their course and usually go away on their own. Most of the time, a URI does not require medical attention, but sometimes a bacterial infection in the upper airways can follow a viral infection. This is called a secondary infection. Sinus and middle ear infections are common types of secondary upper respiratory infections. Bacterial pneumonia can also complicate a URI. A URI can worsen asthma and chronic obstructive pulmonary disease (COPD). Sometimes, these complications can require emergency medical care and may be life threatening.  CAUSES Almost all URIs are caused by viruses. A virus is a type of germ and can spread from one person to another.  RISKS FACTORS You may be at risk for a URI if:   You smoke.   You have chronic heart or lung disease.  You have a weakened defense (immune) system.   You are very young or very old.   You have nasal allergies or asthma.  You work in crowded or poorly ventilated areas.  You work in health care facilities or schools. SIGNS AND SYMPTOMS  Symptoms typically develop 2-3 days after you come in contact with a cold virus. Most viral URIs last 7-10 days. However, viral URIs from the influenza virus (flu virus) can last 14-18 days and are typically more severe. Symptoms may include:   Runny or stuffy (congested) nose.   Sneezing.   Cough.   Sore throat.   Headache.   Fatigue.   Fever.   Loss of appetite.   Pain in your forehead, behind your eyes, and over your cheekbones (sinus pain).  Muscle aches.  DIAGNOSIS  Your health care provider may diagnose a URI by:  Physical exam.  Tests to check that your symptoms are not due to  another condition such as:  Strep throat.  Sinusitis.  Pneumonia.  Asthma. TREATMENT  A URI goes away on its own with time. It cannot be cured with medicines, but medicines may be prescribed or recommended to relieve symptoms. Medicines may help:  Reduce your fever.  Reduce your cough.  Relieve nasal congestion. HOME CARE INSTRUCTIONS   Take medicines only as directed by your health care provider.   Gargle warm saltwater or take cough drops to comfort your throat as directed by your health care provider.  Use a warm mist humidifier or inhale steam from a shower to increase air moisture. This may make it easier to breathe.  Drink enough fluid to keep your urine clear or pale yellow.   Eat soups and other clear broths and maintain good nutrition.   Rest as needed.   Return to work when your temperature has returned to normal or as your health care provider advises. You may need to stay home longer to avoid infecting others. You can also use a face mask and careful hand washing to prevent spread of the virus.  Increase the usage of your inhaler if you have asthma.   Do not use any tobacco products, including cigarettes, chewing tobacco, or electronic cigarettes. If you need help quitting, ask your health care provider. PREVENTION  The best way to protect yourself from getting a cold is to practice good hygiene.   Avoid oral or hand contact with people with cold   symptoms.   Wash your hands often if contact occurs.  There is no clear evidence that vitamin C, vitamin E, echinacea, or exercise reduces the chance of developing a cold. However, it is always recommended to get plenty of rest, exercise, and practice good nutrition.  SEEK MEDICAL CARE IF:   You are getting worse rather than better.   Your symptoms are not controlled by medicine.   You have chills.  You have worsening shortness of breath.  You have brown or red mucus.  You have yellow or brown nasal  discharge.  You have pain in your face, especially when you bend forward.  You have a fever.  You have swollen neck glands.  You have pain while swallowing.  You have white areas in the back of your throat. SEEK IMMEDIATE MEDICAL CARE IF:   You have severe or persistent:  Headache.  Ear pain.  Sinus pain.  Chest pain.  You have chronic lung disease and any of the following:  Wheezing.  Prolonged cough.  Coughing up blood.  A change in your usual mucus.  You have a stiff neck.  You have changes in your:  Vision.  Hearing.  Thinking.  Mood. MAKE SURE YOU:   Understand these instructions.  Will watch your condition.  Will get help right away if you are not doing well or get worse.   This information is not intended to replace advice given to you by your health care provider. Make sure you discuss any questions you have with your health care provider.   Document Released: 05/31/2001 Document Revised: 04/21/2015 Document Reviewed: 03/12/2014 Elsevier Interactive Patient Education 2016 [employer].  

## 2016-03-20 NOTE — ED Provider Notes (Signed)
CSN: [id number]     Arrival date & time 03/20/16  0152 History   First MD Initiated Contact with Patient 03/20/16 0225     Chief Complaint  Patient presents with  . Hypertension      HPI Patient presents to the emergency department with complaints of chills and cough over the past 2 days as well as mild sore throat.  She does have a history of congestive heart failure and hypertension.  She also reports that she's been having issues with her blood pressure and her blood pressure meds are currently being reevaluated by primary care physician.  At home her blood pressure was elevated to 200/140.  Patient denies palpitations.  No back pain.  Denies nausea vomiting and diarrhea.   Past Medical History  Diagnosis Date  . Hypertension   . Obesity   . Blood transfusion without reported diagnosis   . Anemia   . CHF (congestive heart failure) (HCC)   . Chronic neck pain   . Paresthesias    Past Surgical History  Procedure Laterality Date  . Cesarean section     Family History  Problem Relation Age of Onset  . Cancer Mother   . Heart attack Father   . Cancer Brother    Social History  Substance Use Topics  . Smoking status: Never Smoker   . Smokeless tobacco: Never Used  . Alcohol Use: No   OB History    Gravida Para Term Preterm AB TAB SAB Ectopic Multiple Living   Review of Systems  All other systems reviewed and are negative.     Allergies  Iodine  Home Medications   Prior to Admission medications   Medication Sig Start Date End Date Taking? Authorizing Provider  aspirin 81 MG chewable tablet Chew 81 mg by mouth once.    Yes Historical Provider, MD  beclomethasone (QVAR) 80 MCG/ACT inhaler Inhale into the lungs 2 (two) times daily.   Yes Historical Provider, MD  carvedilol (COREG) 6.25 MG tablet Take 6.25 mg by mouth 2 (two) times daily.    Yes Historical Provider, MD  fluticasone (FLONASE) 50 MCG/ACT nasal spray Inhale 1 spray into the lungs  daily. 03/07/14  Yes Historical Provider, MD  furosemide (LASIX) 40 MG tablet Take 40 mg by mouth 2 (two) times daily.    Yes Historical Provider, MD  topiramate (TOPAMAX) 50 MG tablet Take 50 mg by mouth 2 (two) times daily. 03/11/14  Yes Historical Provider, MD  albuterol-ipratropium (COMBIVENT) 18-103 MCG/ACT inhaler Inhale 2 puffs into the lungs every 6 (six) hours as needed for shortness of breath. 10/22/12   Ivery Quale, PA-C  benazepril (LOTENSIN) 10 MG tablet Take 40 mg by mouth daily.     Historical Provider, MD  lisinopril (PRINIVIL,ZESTRIL) 20 MG tablet 1 po q day 10/28/14   Rolland Porter, MD  naproxen (NAPROSYN) 500 MG tablet Take 1 tablet (500 mg total) by mouth 2 (two) times daily. 12/10/15   Dione Booze, MD  oxyCODONE-acetaminophen (PERCOCET/ROXICET) 5-325 MG tablet Take 1 tablet by mouth every 4 (four) hours as needed. 12/10/15   Dione Booze, MD  potassium chloride SA (K-DUR,KLOR-CON) 20 MEQ tablet Take 20 mEq by mouth once. *Take one tablet by  Mouth only on days that Lasix (Furosemide) is taken    Historical Provider, MD   BP 137/89 mmHg  Pulse 95  Temp(Src) 99.5 F (37.5 C) (Oral)  Resp 20  Ht 5\' 3"  (1.6 m)  Wt 301 lb (136.533 kg)  BMI 53.33 kg/m2  SpO2 94%  LMP 11/30/2015 Physical Exam  Constitutional: She is oriented to person, place, and time. She appears well-developed and well-nourished. No distress.  HENT:  Head: Normocephalic and atraumatic.  Posterior pharynx is normal.  Uvula is midline.  No tonsillar swelling or exudate  Eyes: EOM are normal.  Neck: Normal range of motion.  Cardiovascular: Normal rate, regular rhythm and normal heart sounds.   Pulmonary/Chest: Effort normal and breath sounds normal.  Abdominal: Soft. She exhibits no distension. There is no tenderness.  Musculoskeletal: Normal range of motion.  Neurological: She is alert and oriented to person, place, and time.  Skin: Skin is warm and dry.  Psychiatric: She has a normal mood and affect.  Judgment normal.  Nursing note and vitals reviewed.   ED Course  Procedures (including critical care time) Labs Review Labs Reviewed - No data to display  Imaging Review Dg Chest 2 View  03/20/2016  CLINICAL DATA:  Acute onset of cough, and shortness of breath. Initial encounter. EXAM: CHEST  2 VIEW COMPARISON:  Chest radiograph performed 01/30/2016 FINDINGS: The lungs are well-aerated. Mild peribronchial thickening is noted. There is no evidence of focal opacification, pleural effusion or pneumothorax. The heart is normal in size; the mediastinal contour is within normal limits. No acute osseous abnormalities are seen. IMPRESSION: Mild peribronchial thickening noted.  Lungs remain otherwise clear. Electronically Signed   By: Roanna Raider M.D.   On: 03/20/2016 04:33   I have personally reviewed and evaluated these images and lab results as part of my medical decision-making.   EKG Interpretation   Date/Time:  Sunday March 20 2016 02:14:18 EDT Ventricular Rate:  97 PR Interval:  144 QRS Duration: 91 QT Interval:  340 QTC Calculation: 432 R Axis:   -3 Text Interpretation:  Sinus rhythm Repol abnrm suggests ischemia, lateral  leads No significant change was found Confirmed by Jaira Canady  MD, Reason Helzer  ([id number]) on 03/20/2016 4:41:57 AM      MDM   Final diagnoses:  None    Likely viral upper respiratory tract infection with viral bronchitis.  Patient was given albuterol for her cough.  Chest x-ray without pneumonia.  Discharge home in good condition.  Primary care follow-up.  She understands to return to the ER for new or worsening symptoms    Azalia Bilis, MD 03/20/16 [phone]

## 2016-03-20 NOTE — ED Notes (Signed)
Pt c/o sob, elevated blood pressure for the past two days with chills and cough

## 2016-04-05 ENCOUNTER — Emergency Department (HOSPITAL_COMMUNITY)
Admission: EM | Admit: 2016-04-05 | Discharge: 2016-04-05 | Disposition: A | Discharge status: Home / Self Care | Payer: Self-pay | Attending: Emergency Medicine | Admitting: Emergency Medicine

## 2016-04-05 ENCOUNTER — Encounter (HOSPITAL_COMMUNITY): Payer: Self-pay | Admitting: *Deleted

## 2016-04-05 ENCOUNTER — Emergency Department (HOSPITAL_COMMUNITY): Payer: Self-pay

## 2016-04-05 DIAGNOSIS — I509 Heart failure, unspecified: Secondary | ICD-10-CM | POA: Insufficient documentation

## 2016-04-05 DIAGNOSIS — Z79899 Other long term (current) drug therapy: Secondary | ICD-10-CM | POA: Insufficient documentation

## 2016-04-05 DIAGNOSIS — J069 Acute upper respiratory infection, unspecified: Secondary | ICD-10-CM | POA: Insufficient documentation

## 2016-04-05 DIAGNOSIS — I110 Hypertensive heart disease with heart failure: Secondary | ICD-10-CM | POA: Insufficient documentation

## 2016-04-05 DIAGNOSIS — E669 Obesity, unspecified: Secondary | ICD-10-CM | POA: Insufficient documentation

## 2016-04-05 DIAGNOSIS — J029 Acute pharyngitis, unspecified: Secondary | ICD-10-CM

## 2016-04-05 MED ORDER — CETIRIZINE HCL 10 MG PO TABS: 10.0000 mg | ORAL_TABLET | Freq: Every day | ORAL | Status: DC

## 2016-04-05 MED ORDER — GUAIFENESIN-CODEINE 100-10 MG/5ML PO SYRP: 10.0000 mL | ORAL_SOLUTION | Freq: Three times a day (TID) | ORAL | Status: DC | PRN

## 2016-04-05 MED ORDER — GUAIFENESIN-CODEINE 100-10 MG/5ML PO SOLN
10.0000 mL | Freq: Once | ORAL | Status: AC
  Administered 2016-04-05: 10 mL via ORAL
  Filled 2016-04-05: qty 10

## 2016-04-05 NOTE — ED Provider Notes (Signed)
CSN: [id number]     Arrival date & time 04/05/16  1156 History   First MD Initiated Contact with Patient 04/05/16 1236     Chief Complaint  Patient presents with  . Sore Throat     (Consider location/radiation/quality/duration/timing/severity/associated sxs/prior Treatment) HPI   [name] is a 50 y.o. female who presents to the Emergency Department complaining of cough, nasal congestion, and sore throat for two days.  She states the cough has been non-productive. She states the sore throat is worse with swallowing.  She denies known fever but states she has not taken her temp.  No recent sick contacts.  She denies CP, shortness of breath, n/v, neck pain or stiffness.   Past Medical History  Diagnosis Date  . Obesity   . Anemia   . CHF (congestive heart failure) (HCC)   . Chronic neck pain   . Paresthesias   . Hypertension   . Blood transfusion without reported diagnosis    Past Surgical History  Procedure Laterality Date  . Cesarean section     Family History  Problem Relation Age of Onset  . Cancer Mother   . Heart attack Father   . Cancer Brother    Social History  Substance Use Topics  . Smoking status: Never Smoker   . Smokeless tobacco: Never Used  . Alcohol Use: No   OB History    Gravida Para Term Preterm AB TAB SAB Ectopic Multiple Living   Review of Systems  Constitutional: Negative for fever, chills, activity change and appetite change.  HENT: Positive for congestion, rhinorrhea and sore throat. Negative for facial swelling and trouble swallowing.   Eyes: Negative for visual disturbance.  Respiratory: Positive for cough. Negative for shortness of breath, wheezing and stridor.   Gastrointestinal: Negative for nausea, vomiting and abdominal pain.  Genitourinary: Negative for dysuria.  Musculoskeletal: Negative for neck pain and neck stiffness.  Skin: Negative.  Negative for rash.  Neurological: Negative for dizziness, weakness,  numbness and headaches.  Hematological: Negative for adenopathy.  Psychiatric/Behavioral: Negative for confusion.  All other systems reviewed and are negative.     Allergies  Iodine  Home Medications   Prior to Admission medications   Medication Sig Start Date End Date Taking? Authorizing Provider  albuterol-ipratropium (COMBIVENT) 18-103 MCG/ACT inhaler Inhale 2 puffs into the lungs every 6 (six) hours as needed for shortness of breath. 10/22/12   Ivery Quale, PA-C  aspirin 81 MG chewable tablet Chew 81 mg by mouth once.     Historical Provider, MD  beclomethasone (QVAR) 80 MCG/ACT inhaler Inhale into the lungs 2 (two) times daily.    Historical Provider, MD  benazepril (LOTENSIN) 10 MG tablet Take 40 mg by mouth daily.     Historical Provider, MD  carvedilol (COREG) 6.25 MG tablet Take 6.25 mg by mouth 2 (two) times daily.     Historical Provider, MD  fluticasone (FLONASE) 50 MCG/ACT nasal spray Inhale 1 spray into the lungs daily. 03/07/14   Historical Provider, MD  furosemide (LASIX) 40 MG tablet Take 40 mg by mouth 2 (two) times daily.     Historical Provider, MD  lisinopril (PRINIVIL,ZESTRIL) 20 MG tablet 1 po q day 10/28/14   Rolland Porter, MD  naproxen (NAPROSYN) 500 MG tablet Take 1 tablet (500 mg total) by mouth 2 (two) times daily. 12/10/15   Dione Booze, MD  oxyCODONE-acetaminophen (PERCOCET/ROXICET) 5-325 MG tablet Take  1 tablet by mouth every 4 (four) hours as needed. 12/10/15   Dione Booze, MD  potassium chloride SA (K-DUR,KLOR-CON) 20 MEQ tablet Take 20 mEq by mouth once. *Take one tablet by  Mouth only on days that Lasix (Furosemide) is taken    Historical Provider, MD  topiramate (TOPAMAX) 50 MG tablet Take 50 mg by mouth 2 (two) times daily. 03/11/14   Historical Provider, MD   BP 176/98 mmHg  Pulse 76  Temp(Src) 99.3 F (37.4 C) (Temporal)  Resp 18  Ht 5\' 3"  (1.6 m)  Wt 134.718 kg  BMI 52.62 kg/m2  SpO2 99%  LMP 11/30/2015 Physical Exam  Constitutional: She is  oriented to person, place, and time. She appears well-developed and well-nourished. No distress.  HENT:  Head: Normocephalic and atraumatic.  Right Ear: Tympanic membrane and ear canal normal.  Left Ear: Tympanic membrane and ear canal normal.  Nose: Mucosal edema present. No rhinorrhea.  Mouth/Throat: Uvula is midline and mucous membranes are normal. No oral lesions. No trismus in the jaw. No uvula swelling. Posterior oropharyngeal erythema present. No oropharyngeal exudate, posterior oropharyngeal edema or tonsillar abscesses.  Eyes: Conjunctivae are normal.  Neck: Normal range of motion and phonation normal. Neck supple. No Brudzinski's sign and no Kernig's sign noted.  Cardiovascular: Normal rate, regular rhythm and intact distal pulses.   Pulmonary/Chest: Effort normal and breath sounds normal. No respiratory distress. She has no wheezes. She has no rales.  Abdominal: Soft. She exhibits no distension. There is no tenderness. There is no rebound and no guarding.  Musculoskeletal: Normal range of motion. She exhibits no edema.  Lymphadenopathy:    She has no cervical adenopathy.  Neurological: She is alert and oriented to person, place, and time. She exhibits normal muscle tone. Coordination normal.  Skin: Skin is warm and dry.  Psychiatric: She has a normal mood and affect.  Nursing note and vitals reviewed.   ED Course  Procedures (including critical care time) Labs Review Labs Reviewed - No data to display  Imaging Review Dg Chest 2 View  04/05/2016  CLINICAL DATA:  Productive cough and sore throat with shortness of breath. EXAM: CHEST  2 VIEW COMPARISON:  03/20/2016 FINDINGS: The lungs are clear wiithout focal pneumonia, edema, pneumothorax or pleural effusion. Cardiopericardial silhouette is at upper limits of normal for size. Interstitial markings are diffusely coarsened with chronic features. The visualized bony structures of the thorax are intact. IMPRESSION: No active  cardiopulmonary disease. Electronically Signed   By: Kennith Center M.D.   On: 04/05/2016 12:44   I have personally reviewed and evaluated these images and lab results as part of my medical decision-making.   EKG Interpretation None      MDM   Final diagnoses:  Pharyngitis  URI (upper respiratory infection)    Pt well appearing.  CXR neg.  Vitals stable.  Symptoms likely viral.  Pt agrees to symptomatic tx and close pmd f/u.      Rosey Bath 04/07/16 2111  Zadie Rhine, MD 04/08/16 [phone]

## 2016-04-05 NOTE — ED Notes (Signed)
Pt. C/o sore throat. Productive cough, clear sputum. Unsure of fevers.

## 2016-04-05 NOTE — Discharge Instructions (Signed)

## 2016-04-21 ENCOUNTER — Telehealth: Payer: Self-pay | Admitting: Pulmonary Disease

## 2016-04-21 NOTE — Telephone Encounter (Signed)
IMAGING CXR PA/LAT 04/06/16 (personally reviewed by me): Heart with borderline cardiomegaly. Heart is somewhat globular in size. No pleural effusion or thickening appreciated. No parenchymal nodule or opacity appreciated. Mediastinum normal in contour.  CARDIAC TTE (01/06/11): LV mildly dilated with mild LVH. EF 20-30% with severe global hypokinesis. LA mild to moderately dilated. RA mildly dilated. RV normal in size with mildly increased wall thickness. Moderate reduction in RV systolic function. Pulmonary artery normal in size. No aortic stenosis or regurgitation. No mitral stenosis or regurgitation. No pulmonic regurgitation. Trivial tricuspid regurgitation.  LABS 03/03/11 ABG: 7.41/35/62/92%

## 2016-04-27 ENCOUNTER — Institutional Professional Consult (permissible substitution): Payer: Self-pay | Admitting: Pulmonary Disease

## 2016-04-29 ENCOUNTER — Institutional Professional Consult (permissible substitution): Payer: Self-pay | Admitting: Pulmonary Disease

## 2016-11-11 ENCOUNTER — Emergency Department (HOSPITAL_COMMUNITY): Payer: Medicaid Other

## 2016-11-11 ENCOUNTER — Encounter (HOSPITAL_COMMUNITY): Payer: Self-pay | Admitting: Emergency Medicine

## 2016-11-11 ENCOUNTER — Inpatient Hospital Stay (HOSPITAL_COMMUNITY)
Admission: EM | Admit: 2016-11-11 | Discharge: 2016-11-13 | DRG: 292 | Disposition: A | Discharge status: Home / Self Care | Payer: Medicaid Other | Attending: Internal Medicine | Admitting: Internal Medicine

## 2016-11-11 DIAGNOSIS — Z7982 Long term (current) use of aspirin: Secondary | ICD-10-CM

## 2016-11-11 DIAGNOSIS — I110 Hypertensive heart disease with heart failure: Principal | ICD-10-CM | POA: Diagnosis present

## 2016-11-11 DIAGNOSIS — G8929 Other chronic pain: Secondary | ICD-10-CM | POA: Diagnosis present

## 2016-11-11 DIAGNOSIS — I160 Hypertensive urgency: Secondary | ICD-10-CM | POA: Diagnosis present

## 2016-11-11 DIAGNOSIS — Z6841 Body Mass Index (BMI) 40.0 and over, adult: Secondary | ICD-10-CM

## 2016-11-11 DIAGNOSIS — M542 Cervicalgia: Secondary | ICD-10-CM | POA: Diagnosis present

## 2016-11-11 DIAGNOSIS — Z79899 Other long term (current) drug therapy: Secondary | ICD-10-CM

## 2016-11-11 DIAGNOSIS — E876 Hypokalemia: Secondary | ICD-10-CM | POA: Diagnosis present

## 2016-11-11 DIAGNOSIS — I5022 Chronic systolic (congestive) heart failure: Secondary | ICD-10-CM | POA: Diagnosis present

## 2016-11-11 DIAGNOSIS — I4891 Unspecified atrial fibrillation: Secondary | ICD-10-CM | POA: Diagnosis present

## 2016-11-11 DIAGNOSIS — R079 Chest pain, unspecified: Secondary | ICD-10-CM | POA: Diagnosis present

## 2016-11-11 DIAGNOSIS — Z8249 Family history of ischemic heart disease and other diseases of the circulatory system: Secondary | ICD-10-CM

## 2016-11-11 DIAGNOSIS — Z7951 Long term (current) use of inhaled steroids: Secondary | ICD-10-CM

## 2016-11-11 DIAGNOSIS — Z888 Allergy status to other drugs, medicaments and biological substances status: Secondary | ICD-10-CM

## 2016-11-11 HISTORY — DX: Bronchitis, not specified as acute or chronic: J40

## 2016-11-11 HISTORY — DX: Unspecified asthma, uncomplicated: J45.909

## 2016-11-11 LAB — CBC
HEMATOCRIT: 37.8 % (ref 36.0–46.0)
Hemoglobin: 12.1 g/dL (ref 12.0–15.0)
MCH: 28.5 pg (ref 26.0–34.0)
MCHC: 32 g/dL (ref 30.0–36.0)
MCV: 88.9 fL (ref 78.0–100.0)
Platelets: 275 10*3/uL (ref 150–400)
RBC: 4.25 MIL/uL (ref 3.87–5.11)
RDW: 14.1 % (ref 11.5–15.5)
WBC: 4.7 10*3/uL (ref 4.0–10.5)

## 2016-11-11 LAB — D-DIMER, QUANTITATIVE: D-Dimer, Quant: 0.35 ug/mL-FEU (ref 0.00–0.50)

## 2016-11-11 MED ORDER — ASPIRIN 81 MG PO CHEW
324.0000 mg | CHEWABLE_TABLET | Freq: Once | ORAL | Status: AC
  Administered 2016-11-11: 324 mg via ORAL
  Filled 2016-11-11: qty 4

## 2016-11-11 MED ORDER — GI COCKTAIL ~~LOC~~
30.0000 mL | Freq: Once | ORAL | Status: AC
  Administered 2016-11-11: 30 mL via ORAL
  Filled 2016-11-11: qty 30

## 2016-11-11 NOTE — ED Provider Notes (Signed)
[facility]-EMERGENCY DEPT Provider Note   CSN: [id number] Arrival date & time: 11/11/16  2128  By signing my name below, I, [name], attest that this documentation has been prepared under the direction and in the presence of [name], MD . Electronically Signed: Majel [name], Scribe. 11/11/2016. 11:02 PM.  History   Chief Complaint Chief Complaint  Patient presents with  . Chest Pain   The history is provided by the patient. No language interpreter was used.   HPI Comments: [name] is a 50 y.o. female with PMHx of CHF,  HTN, and A-Fib, who presents to the Emergency Department complaining of gradually worsening, constant, central chest pain that began ~1 hour PTA. Pt reports her pain began while driving back to her home from [place]. She states her pain radiates into the right side of her neck with no exacerbating or relieving factors. She states this is the first time she has experienced similar pain. She notes she has not taken any medication for her pain as she came "straight to the ED and didn't want to take any chances."  Pt also reports associated right leg pain centralized behind her right knee that began 1 week ago. She denies headache, abdominal pain, shortness of breath, diaphoresis, lightheadedness, back pain, nausea, vomiting, hx of MI, PE, DM or stress test. She notes she has been taking her Lasix as prescribed.   Past Medical History:  Diagnosis Date  . Anemia   . Blood transfusion without reported diagnosis   . CHF (congestive heart failure) (HCC)   . Chronic neck pain   . Hypertension   . Obesity   . Paresthesias    There are no active problems to display for this patient.  Past Surgical History:  Procedure Laterality Date  . CESAREAN SECTION      OB History    Gravida Para Term Preterm AB Living   3 3 3     3    SAB TAB Ectopic Multiple Live Births                 Home Medications    Prior to Admission medications   Medication Sig Start Date  End Date Taking? Authorizing Provider  aspirin 81 MG chewable tablet Chew 81 mg by mouth daily.    Yes Historical Provider, MD  albuterol-ipratropium (COMBIVENT) 18-103 MCG/ACT inhaler Inhale 2 puffs into the lungs every 6 (six) hours as needed for shortness of breath. 10/22/12   Ivery Quale, PA-C  beclomethasone (QVAR) 80 MCG/ACT inhaler Inhale into the lungs 2 (two) times daily.    Historical Provider, MD  benazepril (LOTENSIN) 10 MG tablet Take 40 mg by mouth daily.     Historical Provider, MD  carvedilol (COREG) 6.25 MG tablet Take 6.25 mg by mouth 2 (two) times daily.     Historical Provider, MD  cetirizine (ZYRTEC) 10 MG tablet Take 1 tablet (10 mg total) by mouth daily. 04/05/16   Tammy Triplett, PA-C  fluticasone (FLONASE) 50 MCG/ACT nasal spray Inhale 1 spray into the lungs daily. 03/07/14   Historical Provider, MD  furosemide (LASIX) 40 MG tablet Take 40 mg by mouth 2 (two) times daily.     Historical Provider, MD  guaiFENesin-codeine (ROBITUSSIN AC) 100-10 MG/5ML syrup Take 10 mLs by mouth 3 (three) times daily as needed. 04/05/16   Tammy Triplett, PA-C  lisinopril (PRINIVIL,ZESTRIL) 20 MG tablet 1 po q day 10/28/14   Rolland Porter, MD  naproxen (NAPROSYN) 500 MG tablet Take 1 tablet (500  mg total) by mouth 2 (two) times daily. 12/10/15   Dione Boozeavid Glick, MD  oxyCODONE-acetaminophen (PERCOCET/ROXICET) 5-325 MG tablet Take 1 tablet by mouth every 4 (four) hours as needed. 12/10/15   Dione Boozeavid Glick, MD  potassium chloride SA (K-DUR,KLOR-CON) 20 MEQ tablet Take 20 mEq by mouth once. *Take one tablet by  Mouth only on days that Lasix (Furosemide) is taken    Historical Provider, MD  topiramate (TOPAMAX) 50 MG tablet Take 50 mg by mouth 2 (two) times daily. 03/11/14   Historical Provider, MD    Family History Family History  Problem Relation Age of Onset  . Cancer Mother   . Heart attack Father   . Cancer Brother     Social History Social History  Substance Use Topics  . Smoking status: Never  Smoker  . Smokeless tobacco: Never Used  . Alcohol use No     Allergies   Iodine   Review of Systems Review of Systems  Constitutional: Negative for diaphoresis.  Respiratory: Negative for shortness of breath.   Cardiovascular: Positive for chest pain.  Gastrointestinal: Negative for abdominal pain, nausea and vomiting.  Musculoskeletal: Positive for arthralgias (right leg pain). Negative for back pain.  Neurological: Negative for light-headedness and headaches.   Physical Exam Updated Vital Signs BP 200/96   Pulse 67   Temp 97.9 F (36.6 C) (Oral)   Resp 23   Ht 5\' 3"  (1.6 m)   Wt 297 lb (134.7 kg)   LMP 09/30/2015   SpO2 99%   BMI 52.61 kg/m   Physical Exam  Constitutional: She is oriented to person, place, and time. She appears well-developed and well-nourished. No distress.  Obese  HENT:  Head: Normocephalic and atraumatic.  Mouth/Throat: Oropharynx is clear and moist. No oropharyngeal exudate.  Eyes: Conjunctivae and EOM are normal. Pupils are equal, round, and reactive to light.  Neck: Normal range of motion. Neck supple.  No meningismus.  Cardiovascular: Normal rate, regular rhythm, normal heart sounds and intact distal pulses.   No murmur heard. Pulmonary/Chest: Effort normal and breath sounds normal. No respiratory distress.  Lungs clear to auscultation bilaterally, no chest wall tenderness.   Abdominal: Soft. There is no tenderness. There is no rebound and no guarding.  Musculoskeletal: Normal range of motion. She exhibits tenderness. She exhibits no edema.  Tenderness to anterior right knee, full ROM, no swelling or erythema, no effusion, intact DP pulses.    Neurological: She is alert and oriented to person, place, and time. No cranial nerve deficit. She exhibits normal muscle tone. Coordination normal.   5/5 strength throughout. CN 2-12 intact.Equal grip strength.   Skin: Skin is warm.  Psychiatric: She has a normal mood and affect. Her behavior is  normal.  Nursing note and vitals reviewed.  ED Treatments / Results  Labs (all labs ordered are listed, but only abnormal results are displayed) Labs Reviewed  BASIC METABOLIC PANEL - Abnormal; Notable for the following:       Result Value   Potassium 3.3 (*)    Glucose, Bld 108 (*)    Creatinine, Ser 1.03 (*)    All other components within normal limits  CBC  TROPONIN I  BRAIN NATRIURETIC PEPTIDE  D-DIMER, QUANTITATIVE (NOT AT [facility])  TROPONIN I  MAGNESIUM  LIPID PANEL  TROPONIN I  TROPONIN I  HEMOGLOBIN A1C    EKG  EKG Interpretation  Date/Time:  Friday November 11 2016 21:33:53 EST Ventricular Rate:  76 PR Interval:  138 QRS Duration: 88  QT Interval:  388 QTC Calculation: 436 R Axis:   32 Text Interpretation:  Normal sinus rhythm T wave abnormality, consider inferior ischemia Abnormal ECG T wave changes new since April 2017 Confirmed by Criss Alvine MD, SCOTT [phone]) on 11/11/2016 10:31:17 PM       Radiology Dg Chest 2 View  Result Date: 11/11/2016 CLINICAL DATA:  Central chest pain and right leg pain onset today. History of CHF and hypertension. EXAM: CHEST  2 VIEW COMPARISON:  04/06/2016 FINDINGS: Borderline heart size with normal pulmonary vascularity. Lungs are clear. No focal consolidation or airspace disease. No blunting of costophrenic angles. No pneumothorax. Mediastinal contours appear intact. Tortuous aorta. IMPRESSION: Mild cardiac enlargement.  No evidence of active pulmonary disease. Electronically Signed   By: Burman Nieves M.D.   On: 11/11/2016 21:51   Dg Knee Complete 4 Views Right  Result Date: 11/12/2016 CLINICAL DATA:  Acute onset of right anterior and medial knee pain. Initial encounter. EXAM: RIGHT KNEE - COMPLETE 4+ VIEW COMPARISON:  None. FINDINGS: There is no evidence of fracture or dislocation. The joint spaces are preserved. No significant degenerative change is seen; the patellofemoral joint is grossly unremarkable in appearance. No  significant joint effusion is seen. The visualized soft tissues are normal in appearance. IMPRESSION: No evidence of fracture or dislocation. Electronically Signed   By: Roanna Raider M.D.   On: 11/12/2016 00:22    Procedures Procedures (including critical care time)  Medications Ordered in ED Medications - No data to display  DIAGNOSTIC STUDIES:  Oxygen Saturation is 99% on RA, normal by my interpretation.    COORDINATION OF CARE:  11:20 PM Discussed treatment plan with pt at bedside and pt agreed to plan.  Initial Impression / Assessment and Plan / ED Course  I have reviewed the triage vital signs and the nursing notes.  Pertinent labs & imaging results that were available during my care of the patient were reviewed by me and considered in my medical decision making (see chart for details).  Clinical Course    Patient with right sided central chest pain onset one hour ago. Denies shortness of breath, nausea, vomiting, diaphoresis. Has pain behind her right leg for about the past 1 month.  EKG is with nonspecific T-wave inversions.. Troponin and d-dimer negative. Patient with significant elevated blood pressure. States compliance with her medications.   patient pain-free after 2 and triglycerides and aspirin. Troponin is negative. Blood pressure is improving. Doubt PE, doubt aortic dissection. Patient's heart score is 4.   With her presentation EKG changes will admit for rule out.  D/w Dr. Antionette Char. Final  Clinical Impressions(s) / ED Diagnoses   Final diagnoses:  Chest pain, unspecified type  Hypertensive urgency    New Prescriptions New Prescriptions   No medications on file     [name], MD 11/12/16 [phone]

## 2016-11-12 ENCOUNTER — Encounter (HOSPITAL_COMMUNITY): Payer: Self-pay | Admitting: Family Medicine

## 2016-11-12 DIAGNOSIS — I160 Hypertensive urgency: Secondary | ICD-10-CM | POA: Diagnosis present

## 2016-11-12 DIAGNOSIS — I5022 Chronic systolic (congestive) heart failure: Secondary | ICD-10-CM | POA: Diagnosis present

## 2016-11-12 DIAGNOSIS — Z6841 Body Mass Index (BMI) 40.0 and over, adult: Secondary | ICD-10-CM | POA: Diagnosis not present

## 2016-11-12 DIAGNOSIS — I110 Hypertensive heart disease with heart failure: Secondary | ICD-10-CM | POA: Diagnosis present

## 2016-11-12 DIAGNOSIS — Z79899 Other long term (current) drug therapy: Secondary | ICD-10-CM | POA: Diagnosis not present

## 2016-11-12 DIAGNOSIS — G8929 Other chronic pain: Secondary | ICD-10-CM | POA: Diagnosis present

## 2016-11-12 DIAGNOSIS — M542 Cervicalgia: Secondary | ICD-10-CM | POA: Diagnosis present

## 2016-11-12 DIAGNOSIS — Z7982 Long term (current) use of aspirin: Secondary | ICD-10-CM | POA: Diagnosis not present

## 2016-11-12 DIAGNOSIS — I4891 Unspecified atrial fibrillation: Secondary | ICD-10-CM | POA: Diagnosis present

## 2016-11-12 DIAGNOSIS — E876 Hypokalemia: Secondary | ICD-10-CM | POA: Diagnosis present

## 2016-11-12 DIAGNOSIS — Z8249 Family history of ischemic heart disease and other diseases of the circulatory system: Secondary | ICD-10-CM | POA: Diagnosis not present

## 2016-11-12 DIAGNOSIS — R079 Chest pain, unspecified: Secondary | ICD-10-CM | POA: Diagnosis not present

## 2016-11-12 DIAGNOSIS — Z7951 Long term (current) use of inhaled steroids: Secondary | ICD-10-CM | POA: Diagnosis not present

## 2016-11-12 DIAGNOSIS — Z888 Allergy status to other drugs, medicaments and biological substances status: Secondary | ICD-10-CM | POA: Diagnosis not present

## 2016-11-12 LAB — BASIC METABOLIC PANEL
ANION GAP: 7 (ref 5–15)
BUN: 12 mg/dL (ref 6–20)
CHLORIDE: 104 mmol/L (ref 101–111)
CO2: 27 mmol/L (ref 22–32)
Calcium: 9 mg/dL (ref 8.9–10.3)
Creatinine, Ser: 1.03 mg/dL — ABNORMAL HIGH (ref 0.44–1.00)
GFR calc Af Amer: >60 mL/min (ref >60)
GLUCOSE: 108 mg/dL — AB (ref 65–99)
POTASSIUM: 3.3 mmol/L — AB (ref 3.5–5.1)
Sodium: 138 mmol/L (ref 135–145)

## 2016-11-12 LAB — TROPONIN I
Troponin I: <0.03 ng/mL (ref <0.03)
Troponin I: <0.03 ng/mL (ref <0.03)

## 2016-11-12 LAB — MAGNESIUM: MAGNESIUM: 1.8 mg/dL (ref 1.7–2.4)

## 2016-11-12 LAB — LIPID PANEL
CHOL/HDL RATIO: 1.9 ratio
Cholesterol: 103 mg/dL (ref 0–200)
HDL: 53 mg/dL (ref >40)
LDL CALC: 41 mg/dL (ref 0–99)
TRIGLYCERIDES: 47 mg/dL (ref <150)
VLDL: 9 mg/dL (ref 0–40)

## 2016-11-12 LAB — BRAIN NATRIURETIC PEPTIDE: B Natriuretic Peptide: 38 pg/mL (ref 0.0–100.0)

## 2016-11-12 MED ORDER — BENAZEPRIL HCL 10 MG PO TABS
40.0000 mg | ORAL_TABLET | Freq: Every day | ORAL | Status: DC
  Administered 2016-11-12 – 2016-11-13 (×2): 40 mg via ORAL
  Filled 2016-11-12 (×2): qty 4
  Filled 2016-11-12 (×3): qty 1

## 2016-11-12 MED ORDER — ONDANSETRON HCL 4 MG/2ML IJ SOLN: 4.0000 mg | Freq: Four times a day (QID) | INTRAMUSCULAR | Status: DC | PRN

## 2016-11-12 MED ORDER — ASPIRIN 81 MG PO CHEW
81.0000 mg | CHEWABLE_TABLET | Freq: Every day | ORAL | Status: DC
  Administered 2016-11-12 – 2016-11-13 (×2): 81 mg via ORAL
  Filled 2016-11-12 (×2): qty 1

## 2016-11-12 MED ORDER — NITROGLYCERIN 0.4 MG SL SUBL
0.4000 mg | SUBLINGUAL_TABLET | SUBLINGUAL | Status: DC | PRN
  Administered 2016-11-12 (×2): 0.4 mg via SUBLINGUAL
  Filled 2016-11-12: qty 1

## 2016-11-12 MED ORDER — NITROGLYCERIN 0.4 MG SL SUBL: 0.4000 mg | SUBLINGUAL_TABLET | SUBLINGUAL | Status: DC | PRN

## 2016-11-12 MED ORDER — FUROSEMIDE 40 MG PO TABS: 40.0000 mg | ORAL_TABLET | ORAL | Status: DC

## 2016-11-12 MED ORDER — MOMETASONE FURO-FORMOTEROL FUM 100-5 MCG/ACT IN AERO
2.0000 | INHALATION_SPRAY | Freq: Two times a day (BID) | RESPIRATORY_TRACT | Status: DC
  Administered 2016-11-12 – 2016-11-13 (×3): 2 via RESPIRATORY_TRACT
  Filled 2016-11-12: qty 8.8

## 2016-11-12 MED ORDER — POTASSIUM CHLORIDE CRYS ER 20 MEQ PO TBCR
40.0000 meq | EXTENDED_RELEASE_TABLET | Freq: Once | ORAL | Status: AC
  Administered 2016-11-12: 40 meq via ORAL
  Filled 2016-11-12: qty 2

## 2016-11-12 MED ORDER — POTASSIUM CHLORIDE CRYS ER 20 MEQ PO TBCR
40.0000 meq | EXTENDED_RELEASE_TABLET | Freq: Every day | ORAL | Status: DC
  Administered 2016-11-12 – 2016-11-13 (×2): 40 meq via ORAL
  Filled 2016-11-12: qty 2
  Filled 2016-11-12: qty 4

## 2016-11-12 MED ORDER — ALPRAZOLAM 0.5 MG PO TABS: 0.2500 mg | ORAL_TABLET | Freq: Two times a day (BID) | ORAL | Status: DC | PRN

## 2016-11-12 MED ORDER — ENOXAPARIN SODIUM 80 MG/0.8ML ~~LOC~~ SOLN
0.5000 mg/kg | SUBCUTANEOUS | Status: DC
  Administered 2016-11-12 – 2016-11-13 (×2): 65 mg via SUBCUTANEOUS
  Filled 2016-11-12 (×2): qty 0.8

## 2016-11-12 MED ORDER — LABETALOL HCL 5 MG/ML IV SOLN
10.0000 mg | INTRAVENOUS | Status: DC | PRN
  Administered 2016-11-12: 10 mg via INTRAVENOUS
  Filled 2016-11-12: qty 4

## 2016-11-12 MED ORDER — GI COCKTAIL ~~LOC~~: 30.0000 mL | Freq: Three times a day (TID) | ORAL | Status: DC | PRN

## 2016-11-12 MED ORDER — METOPROLOL TARTRATE 5 MG/5ML IV SOLN
5.0000 mg | Freq: Once | INTRAVENOUS | Status: AC
  Administered 2016-11-12: 5 mg via INTRAVENOUS
  Filled 2016-11-12: qty 5

## 2016-11-12 MED ORDER — ACETAMINOPHEN 325 MG PO TABS
650.0000 mg | ORAL_TABLET | ORAL | Status: DC | PRN
  Administered 2016-11-13: 650 mg via ORAL
  Filled 2016-11-12: qty 2

## 2016-11-12 MED ORDER — CARVEDILOL 12.5 MG PO TABS
6.2500 mg | ORAL_TABLET | Freq: Two times a day (BID) | ORAL | Status: DC
  Administered 2016-11-12 – 2016-11-13 (×3): 6.25 mg via ORAL
  Filled 2016-11-12 (×3): qty 1

## 2016-11-12 MED ORDER — ASPIRIN EC 81 MG PO TBEC
81.0000 mg | DELAYED_RELEASE_TABLET | Freq: Every day | ORAL | Status: DC
Start: 2016-11-13 — End: 2016-11-13
  Administered 2016-11-13: 81 mg via ORAL

## 2016-11-12 NOTE — Progress Notes (Signed)
FOLLOW UP NOTE FROM ADMISSION EARLIER TODAY:  50 yo with DOE, chest tightness, EKG with non specific ST segment changes, improved with lasix, ASA, and NTG.  She was found to be severely HTN, and was Tx with meds, with improvement.  Troponins were negative, and D dimer was negative. She gave hx of exertional dyspnea and occasionally chest tightness with exertion.  She was given ASA, BB, statin, and her BP has improved.   Exam is normal except for obesity.  Heart lung were unremarkable.  DOE and atypical CP:  I don't think she had ACS, but she has risks for CAD.  Will continue with ASA, statin, BB, and oral lasix.  Supplement her K.  Obtain ECHO and consider stress test.  She is stable otherwise.  Thank you,    Houston Siren MD FACP. Hospitalist.

## 2016-11-12 NOTE — H&P (Signed)
History and Physical    [name] HWE:[id number] DOB: 08-14-1966 DOA: 11/11/2016  PCP: Toma Deiters, MD   Patient coming from: Home  Chief Complaint: Chest pain   HPI: [name] is a 50 y.o. female with medical history significant for chronic systolic CHF, obesity, and hypertension who presents to the emergency department with chest pain that began approximately one hour prior to arrival. Patient reports that she had been in her usual state of health and was riding in a car with her husband when she developed acute and severe pain in the central chest with radiation to the right neck. Pain was described as sharp, unrelenting, no exacerbating or alleviating factors identified, and unlike anything that she had experienced previously. She denies any fevers, chills, or cough, and reports that her weight has been stable and her chronic lower extremity edema has actually improved recently. She denies any dietary indiscretions and reports continued adherence to her treatment plan with Lasix and antihypertensives.  ED Course: Upon arrival to the ED, patient is found to be afebrile, saturating well on room air, hypertensive to the 200/100 range, and with normal heart rate and respirations. EKG demonstrates normal sinus rhythm with T-wave flattening and inversions in the inferolateral leads. Chest x-ray features mild cardiomegaly but is negative for acute cardiopulmonary disease. Chemistry panel is notable for a mild hypokalemia to 3.3 and CBC is unremarkable. D-dimer is within the normal limits, troponin is undetectable, and BNP is also normal at 38. Patient was given a 324 mg aspirin in the emergency department, GI cocktail, and sublingual nitroglycerin. She had complete resolution in her pain with these measures in the ED, but given the EKG changes and significant risk factors, she will be observed on the telemetry unit for ongoing evaluation and management of chest pain concerning for possible  ACS.  Review of Systems:  All other systems reviewed and apart from HPI, are negative.  Past Medical History:  Diagnosis Date  . Anemia   . Blood transfusion without reported diagnosis   . CHF (congestive heart failure) (HCC)   . Chronic neck pain   . Hypertension   . Obesity   . Paresthesias     Past Surgical History:  Procedure Laterality Date  . CESAREAN SECTION       reports that she has never smoked. She has never used smokeless tobacco. She reports that she does not drink alcohol or use drugs.  Allergies  Allergen Reactions  . Iodine     Warm feeling     Family History  Problem Relation Age of Onset  . Cancer Mother   . Heart attack Father   . Cancer Brother      Prior to Admission medications   Medication Sig Start Date End Date Taking? Authorizing Provider  aspirin 81 MG chewable tablet Chew 81 mg by mouth daily.    Yes Historical Provider, MD  benazepril (LOTENSIN) 40 MG tablet Take 40 mg by mouth daily.    Yes Historical Provider, MD  carvedilol (COREG) 6.25 MG tablet Take 6.25 mg by mouth 2 (two) times daily.    Yes Historical Provider, MD  Fluticasone-Salmeterol (ADVAIR) 100-50 MCG/DOSE AEPB Inhale 1-2 puffs into the lungs 2 (two) times daily.   Yes Historical Provider, MD  furosemide (LASIX) 40 MG tablet Take 40 mg by mouth every Monday, Wednesday, and Friday.    Yes Historical Provider, MD  albuterol-ipratropium (COMBIVENT) 18-103 MCG/ACT inhaler Inhale 2 puffs into the lungs every 6 (six)  hours as needed for shortness of breath. 10/22/12   Ivery QualeHobson Bryant, PA-C  potassium chloride SA (K-DUR,KLOR-CON) 20 MEQ tablet Take 20 mEq by mouth once. *Take one tablet by  Mouth only on days that Lasix (Furosemide) is taken    Historical Provider, MD    Physical Exam: Vitals:   11/12/16 0055 11/12/16 0100 11/12/16 0139 11/12/16 0139  BP: (!) 193/103 175/95 166/92 (!) 175/101  Pulse: 76 84    Resp: 23 20    Temp:      TempSrc:      SpO2: 98% 95%    Weight:       Height:          Constitutional: NAD, calm, comfortable, obese Eyes: PERTLA, lids and conjunctivae normal ENMT: Mucous membranes are moist. Posterior pharynx clear of any exudate or lesions.   Neck: normal, supple, no masses, no thyromegaly Respiratory: clear to auscultation bilaterally, no wheezing, no crackles. Normal respiratory effort.   Cardiovascular: S1 & S2 heard, regular rate and rhythm. No extremity edema. No significant JVD. Abdomen: No distension, no tenderness, no masses palpated. Bowel sounds normal.  Musculoskeletal: no clubbing / cyanosis. No joint deformity upper and lower extremities. Normal muscle tone.  Skin: no significant rashes, lesions, ulcers. Warm, dry, well-perfused. Neurologic: CN 2-12 grossly intact. Sensation intact, DTR normal. Strength 5/5 in all 4 limbs.  Psychiatric: Normal judgment and insight. Alert and oriented x 3. Normal mood and affect.     Labs on Admission: I have personally reviewed following labs and imaging studies  CBC:  Recent Labs Lab 11/11/16 2331  WBC 4.7  HGB 12.1  HCT 37.8  MCV 88.9  PLT 275   Basic Metabolic Panel:  Recent Labs Lab 11/11/16 2331  NA 138  K 3.3*  CL 104  CO2 27  GLUCOSE 108*  BUN 12  CREATININE 1.03*  CALCIUM 9.0   GFR: Estimated Creatinine Clearance: 86 mL/min (by C-G formula based on SCr of 1.03 mg/dL (H)). Liver Function Tests: No results for input(s): AST, ALT, ALKPHOS, BILITOT, PROT, ALBUMIN in the last 168 hours. No results for input(s): LIPASE, AMYLASE in the last 168 hours. No results for input(s): AMMONIA in the last 168 hours. Coagulation Profile: No results for input(s): INR, PROTIME in the last 168 hours. Cardiac Enzymes:  Recent Labs Lab 11/11/16 2331  TROPONINI <0.03   BNP (last 3 results) No results for input(s): PROBNP in the last 8760 hours. HbA1C: No results for input(s): HGBA1C in the last 72 hours. CBG: No results for input(s): GLUCAP in the last 168  hours. Lipid Profile: No results for input(s): CHOL, HDL, LDLCALC, TRIG, CHOLHDL, LDLDIRECT in the last 72 hours. Thyroid Function Tests: No results for input(s): TSH, T4TOTAL, FREET4, T3FREE, THYROIDAB in the last 72 hours. Anemia Panel: No results for input(s): VITAMINB12, FOLATE, FERRITIN, TIBC, IRON, RETICCTPCT in the last 72 hours. Urine analysis:    Component Value Date/Time   COLORURINE YELLOW 06/22/2012 1727   APPEARANCEUR CLEAR 06/22/2012 1727   LABSPEC 1.025 06/22/2012 1727   PHURINE 6.0 06/22/2012 1727   GLUCOSEU NEGATIVE 06/22/2012 1727   HGBUR LARGE (A) 06/22/2012 1727   BILIRUBINUR NEGATIVE 06/22/2012 1727   KETONESUR NEGATIVE 06/22/2012 1727   PROTEINUR NEGATIVE 06/22/2012 1727   UROBILINOGEN 0.2 06/22/2012 1727   NITRITE NEGATIVE 06/22/2012 1727   LEUKOCYTESUR NEGATIVE 06/22/2012 1727   Sepsis Labs: @LABRCNTIP (procalcitonin:4,lacticidven:4) )No results found for this or any previous visit (from the past 240 hour(s)).   Radiological Exams on Admission: Dg  Chest 2 View  Result Date: 11/11/2016 CLINICAL DATA:  Central chest pain and right leg pain onset today. History of CHF and hypertension. EXAM: CHEST  2 VIEW COMPARISON:  04/06/2016 FINDINGS: Borderline heart size with normal pulmonary vascularity. Lungs are clear. No focal consolidation or airspace disease. No blunting of costophrenic angles. No pneumothorax. Mediastinal contours appear intact. Tortuous aorta. IMPRESSION: Mild cardiac enlargement.  No evidence of active pulmonary disease. Electronically Signed   By: Burman Nieves M.D.   On: 11/11/2016 21:51   Dg Knee Complete 4 Views Right  Result Date: 11/12/2016 CLINICAL DATA:  Acute onset of right anterior and medial knee pain. Initial encounter. EXAM: RIGHT KNEE - COMPLETE 4+ VIEW COMPARISON:  None. FINDINGS: There is no evidence of fracture or dislocation. The joint spaces are preserved. No significant degenerative change is seen; the patellofemoral joint  is grossly unremarkable in appearance. No significant joint effusion is seen. The visualized soft tissues are normal in appearance. IMPRESSION: No evidence of fracture or dislocation. Electronically Signed   By: Roanna Raider M.D.   On: 11/12/2016 00:22    EKG: Independently reviewed. Normal sinus rhythm, flattening and inversion of T-waves in inferolateral leads  Assessment/Plan  1. Chest pain - Atypical features, but T-wave changes noted on EKG and pain reportedly resolved after NTG in ED  - Initial troponin is undetectable and pt is pain-free on admission  - ASA 324 mg was given in ED  - She will be treated with Lopressor given associated hypertensive urgency  - Monitor on telemetry for ischemic changes, obtain serial troponin measurements, and repeat EKG in am  - Check lipid panel and A1c    2. Hypertensive urgency   - BP elevated to 200/100 on arrival  - She is treated with Lopressor IVP on admission given the potential ACS  - Plan to continue Coreg and benazepril as tolerated   3. Chronic systolic CHF - Appears euvolemic on admission  - TTE (01/06/11) with EF 20-30%, severe global HK, mild-moderate LAE  - SLIV, fluid-restrict diet, follow weight and I/Os  - Continue Lasix, Coreg, benazepril as tolerated   4. Hypokalemia  - Serum potassium 3.3 on admission and she was given 40 mEq K-Dur  - Magnesium level pending; replete prn  - Monitoring on telemetry    DVT prophylaxis: sq Lovenox  Code Status: Full  Family Communication: Husband updated at bediside Disposition Plan: Observe on telemetry Consults called: None Admission status: Observation    Briscoe Deutscher, MD [facility] Pager [phone]  If 7PM-7AM, please contact night-coverage [email or web address] Password [email or web address]  11/12/2016, 1:58 AM

## 2016-11-12 NOTE — ED Notes (Signed)
Pt reports chest pain is gone after taking 2 Nitro tabs - Dr Manus Gunning made aware

## 2016-11-13 ENCOUNTER — Inpatient Hospital Stay (HOSPITAL_COMMUNITY): Payer: Medicaid Other

## 2016-11-13 ENCOUNTER — Other Ambulatory Visit (HOSPITAL_COMMUNITY): Payer: Self-pay | Admitting: Cardiology

## 2016-11-13 DIAGNOSIS — R079 Chest pain, unspecified: Secondary | ICD-10-CM

## 2016-11-13 LAB — HEMOGLOBIN A1C
HEMOGLOBIN A1C: 5.8 % — AB (ref 4.8–5.6)
Mean Plasma Glucose: 120 mg/dL

## 2016-11-13 LAB — ECHOCARDIOGRAM COMPLETE
HEIGHTINCHES: 63 in
Weight: 4526.4 oz

## 2016-11-13 MED ORDER — NITROGLYCERIN 0.4 MG SL SUBL: 0.4000 mg | SUBLINGUAL_TABLET | SUBLINGUAL | 1 refills | Status: DC | PRN

## 2016-11-13 NOTE — Progress Notes (Signed)
Patient discharged home with IV removed and site intact. Patient sent home with her husband, prescriptions and all personal belongings.

## 2016-11-13 NOTE — Discharge Summary (Addendum)
Physician Discharge Summary  Wendy MorgansCathy R [name] OZH:[id number] DOB: 1966-03-30 DOA: 11/11/2016  PCP: Toma DeitersXAJE A HASANAJ, MD  Admit date: 11/11/2016 Discharge date: 11/13/2016  Admitted From: Home.  Disposition:  Home.   Recommendations for Outpatient Follow-up:  1. Follow up with PCP in 1-2 weeks  Home Health:  No.  Equipment/Devices: None.  Discharge Condition: No CP, No SOB. CODE STATUS: FULL CODE.  Diet recommendation: Cardiac diet.   Brief/Interim Summary:  Patient was admitted for chest pain by Dr Antionette Charpyd on Nov 12, 2014.  As per his H and P:  " Wendy MorgansCathy R [name] is a 50 y.o. female with medical history significant for chronic systolic CHF, obesity, and hypertension who presents to the emergency department with chest pain that began approximately one hour prior to arrival. Patient reports that she had been in her usual state of health and was riding in a car with her husband when she developed acute and severe pain in the central chest with radiation to the right neck. Pain was described as sharp, unrelenting, no exacerbating or alleviating factors identified, and unlike anything that she had experienced previously. She denies any fevers, chills, or cough, and reports that her weight has been stable and her chronic lower extremity edema has actually improved recently. She denies any dietary indiscretions and reports continued adherence to her treatment plan with Lasix and antihypertensives.  ED Course: Upon arrival to the ED, patient is found to be afebrile, saturating well on room air, hypertensive to the 200/100 range, and with normal heart rate and respirations. EKG demonstrates normal sinus rhythm with T-wave flattening and inversions in the inferolateral leads. Chest x-ray features mild cardiomegaly but is negative for acute cardiopulmonary disease. Chemistry panel is notable for a mild hypokalemia to 3.3 and CBC is unremarkable. D-dimer is within the normal limits, troponin is undetectable, and  BNP is also normal at 38. Patient was given a 324 mg aspirin in the emergency department, GI cocktail, and sublingual nitroglycerin. She had complete resolution in her pain with these measures in the ED, but given the EKG changes and significant risk factors, she will be observed on the telemetry unit for ongoing evaluation and management of chest pain concerning for possible ACS.  HOSPITAL COURSE:  Patient was admitted into telemetry, observe, and she had no recurrence of her symptoms.  Her meds for HTN was continued, including BB, ACE I, and she was given ASA.  Her troponins were cycled, and remained negative.  Her EKG was not alarming.  She was able to ambulate without any discomfort.  There was significant stress in her family, due to her ill mother, which she felt was the contributing factor to how she felt.  Her BP was controlled.  She was kept in the hospital, and an ECHO was done, which showed normal EF, with questionable hypokinesis of the inferior wall.  She was told to follow up with her PCP in consideration for an outpatient stress test.  She is anxious to go home, and is stable for discharge.  Thank you for allowing me to participate in her care.  Good Day.   Discharge Diagnoses:  Principal Problem:   Chest pain Active Problems:   Chronic systolic CHF (congestive heart failure) (HCC)   Hypokalemia   Hypertensive urgency  Discharge Instructions  Discharge Instructions    Diet - low sodium heart healthy    Complete by:  As directed    Discharge instructions    Complete by:  As directed  Take your medication as instructed. Follow up with your PCP next week.   Increase activity slowly    Complete by:  As directed        Medication List    TAKE these medications   albuterol-ipratropium 18-103 MCG/ACT inhaler Commonly known as:  COMBIVENT Inhale 2 puffs into the lungs every 6 (six) hours as needed for shortness of breath.   aspirin 81 MG chewable tablet Chew 81 mg by mouth  daily.   benazepril 40 MG tablet Commonly known as:  LOTENSIN Take 40 mg by mouth daily.   carvedilol 6.25 MG tablet Commonly known as:  COREG Take 6.25 mg by mouth 2 (two) times daily.   Fluticasone-Salmeterol 100-50 MCG/DOSE Aepb Commonly known as:  ADVAIR Inhale 1-2 puffs into the lungs 2 (two) times daily.   furosemide 40 MG tablet Commonly known as:  LASIX Take 40 mg by mouth every Monday, Wednesday, and Friday.   nitroGLYCERIN 0.4 MG SL tablet Commonly known as:  NITROSTAT Place 1 tablet (0.4 mg total) under the tongue every 5 (five) minutes x 3 doses as needed for chest pain.   potassium chloride SA 20 MEQ tablet Commonly known as:  K-DUR,KLOR-CON Take 20 mEq by mouth once. *Take one tablet by  Mouth only on days that Lasix (Furosemide) is taken       Allergies  Allergen Reactions  . Iodine     Warm feeling     Consultations:  NONE>    Procedures/Studies: Dg Chest 2 View  Result Date: 11/11/2016 CLINICAL DATA:  Central chest pain and right leg pain onset today. History of CHF and hypertension. EXAM: CHEST  2 VIEW COMPARISON:  04/06/2016 FINDINGS: Borderline heart size with normal pulmonary vascularity. Lungs are clear. No focal consolidation or airspace disease. No blunting of costophrenic angles. No pneumothorax. Mediastinal contours appear intact. Tortuous aorta. IMPRESSION: Mild cardiac enlargement.  No evidence of active pulmonary disease. Electronically Signed   By: Burman Nieves M.D.   On: 11/11/2016 21:51   Dg Knee Complete 4 Views Right  Result Date: 11/12/2016 CLINICAL DATA:  Acute onset of right anterior and medial knee pain. Initial encounter. EXAM: RIGHT KNEE - COMPLETE 4+ VIEW COMPARISON:  None. FINDINGS: There is no evidence of fracture or dislocation. The joint spaces are preserved. No significant degenerative change is seen; the patellofemoral joint is grossly unremarkable in appearance. No significant joint effusion is seen. The visualized  soft tissues are normal in appearance. IMPRESSION: No evidence of fracture or dislocation. Electronically Signed   By: Roanna Raider M.D.   On: 11/12/2016 00:22      Subjective:  I feel fine.   Discharge Exam: Vitals:   11/13/16 0430 11/13/16 1533  BP: (!) 158/97 (!) 161/88  Pulse: 71 71  Resp: 18 18  Temp: 97.8 F (36.6 C) 98.5 F (36.9 C)   Vitals:   11/12/16 2003 11/13/16 0430 11/13/16 0818 11/13/16 1533  BP: (!) 170/93 (!) 158/97  (!) 161/88  Pulse: 71 71  71  Resp: 19 18  18   Temp: 98.2 F (36.8 C) 97.8 F (36.6 C)  98.5 F (36.9 C)  TempSrc: Oral Oral  Oral  SpO2: 94% 96% 92% 96%  Weight:  128.3 kg (282 lb 14.4 oz)    Height:        General: Pt is alert, awake, not in acute distress Cardiovascular: RRR, S1/S2 +, no rubs, no gallops Respiratory: CTA bilaterally, no wheezing, no rhonchi Abdominal: Soft, NT, ND, bowel sounds +  Extremities: no edema, no cyanosis    The results of significant diagnostics from this hospitalization (including imaging, microbiology, ancillary and laboratory) are listed below for reference.    Labs: BNP (last 3 results)  Recent Labs  11/11/16 2331  BNP 38.0   Basic Metabolic Panel:  Recent Labs Lab 11/11/16 2331 11/12/16 0319  NA 138  --   K 3.3*  --   CL 104  --   CO2 27  --   GLUCOSE 108*  --   BUN 12  --   CREATININE 1.03*  --   CALCIUM 9.0  --   MG  --  1.8   CBC:  Recent Labs Lab 11/11/16 2331  WBC 4.7  HGB 12.1  HCT 37.8  MCV 88.9  PLT 275   Cardiac Enzymes:  Recent Labs Lab 11/11/16 2331 11/12/16 0319 11/12/16 0902 11/12/16 1452  TROPONINI <0.03 <0.03 <0.03 <0.03   D-Dimer  Recent Labs  11/11/16 2331  DDIMER 0.35   Hgb A1c  Recent Labs  11/12/16 0319  HGBA1C 5.8*   Lipid Profile  Recent Labs  11/12/16 0319  CHOL 103  HDL 53  LDLCALC 41  TRIG 47  CHOLHDL 1.9   Urinalysis    Component Value Date/Time   COLORURINE YELLOW 06/22/2012 1727   APPEARANCEUR CLEAR  06/22/2012 1727   LABSPEC 1.025 06/22/2012 1727   PHURINE 6.0 06/22/2012 1727   GLUCOSEU NEGATIVE 06/22/2012 1727   HGBUR LARGE (A) 06/22/2012 1727   BILIRUBINUR NEGATIVE 06/22/2012 1727   KETONESUR NEGATIVE 06/22/2012 1727   PROTEINUR NEGATIVE 06/22/2012 1727   UROBILINOGEN 0.2 06/22/2012 1727   NITRITE NEGATIVE 06/22/2012 1727   LEUKOCYTESUR NEGATIVE 06/22/2012 1727   Time coordinating discharge: Over 30 minutes  SIGNED:  Houston Siren, MD FACP [facility] 11/13/2016, 5:01 PM   If 7PM-7AM, please contact night-coverage [email or web address] Password [email or web address]

## 2016-11-13 NOTE — Progress Notes (Signed)
**Note De-identified Diva Lemberger Obfuscation** EKG complete and placed in patient chart 

## 2016-11-13 NOTE — Progress Notes (Deleted)
  11/13/16 

## 2016-12-26 IMAGING — DX DG KNEE COMPLETE 4+V*R*
4 series · 4 of 4 positions shown · non-contrast
Comparison: None.

CLINICAL DATA: Acute onset of right anterior and medial knee pain.
Initial encounter.

EXAM:
RIGHT KNEE - COMPLETE 4+ VIEW

[knee ap (1 of 3)]
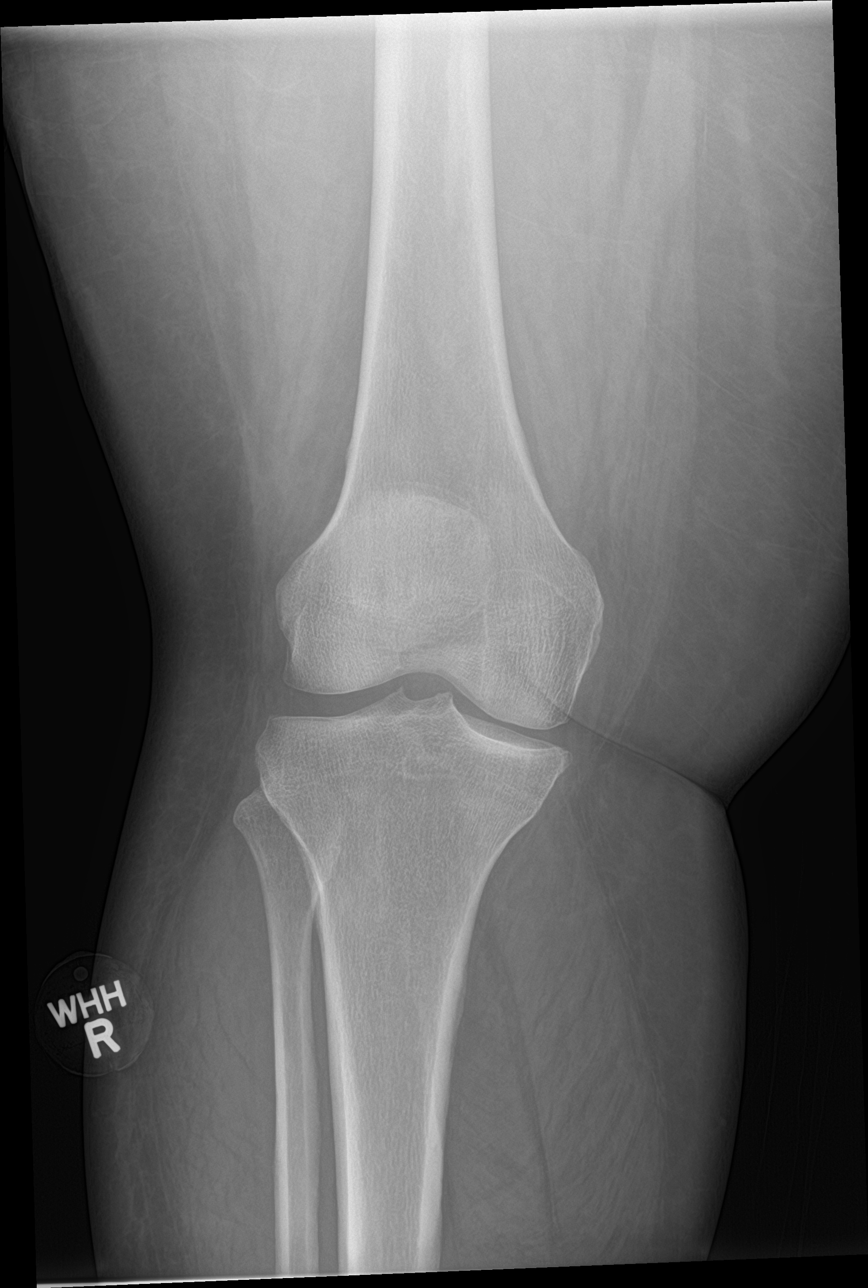

[knee lat]
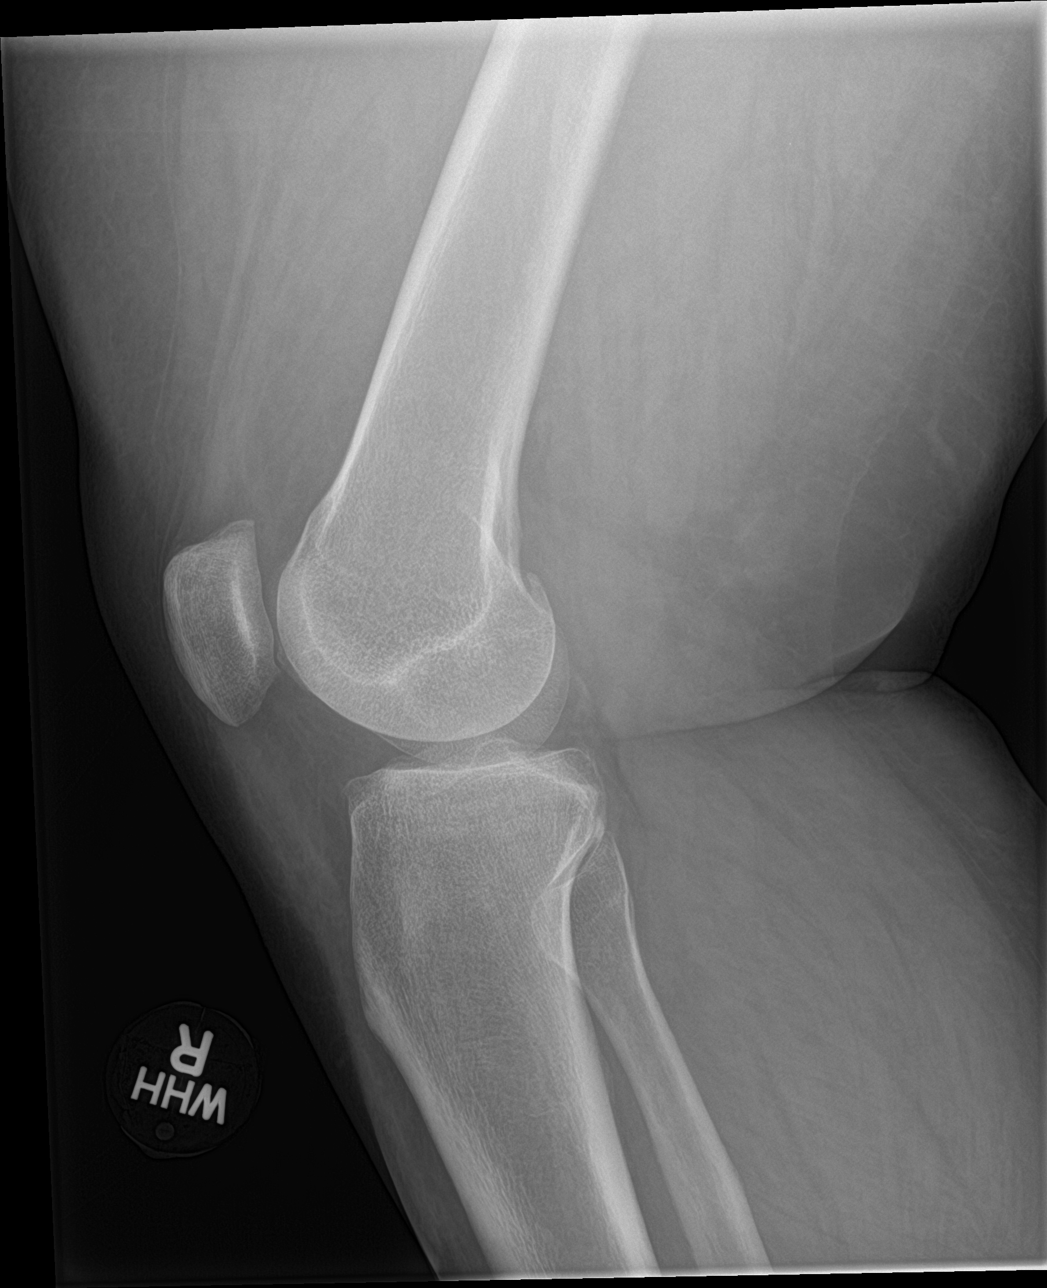

[knee ap (2 of 3)]
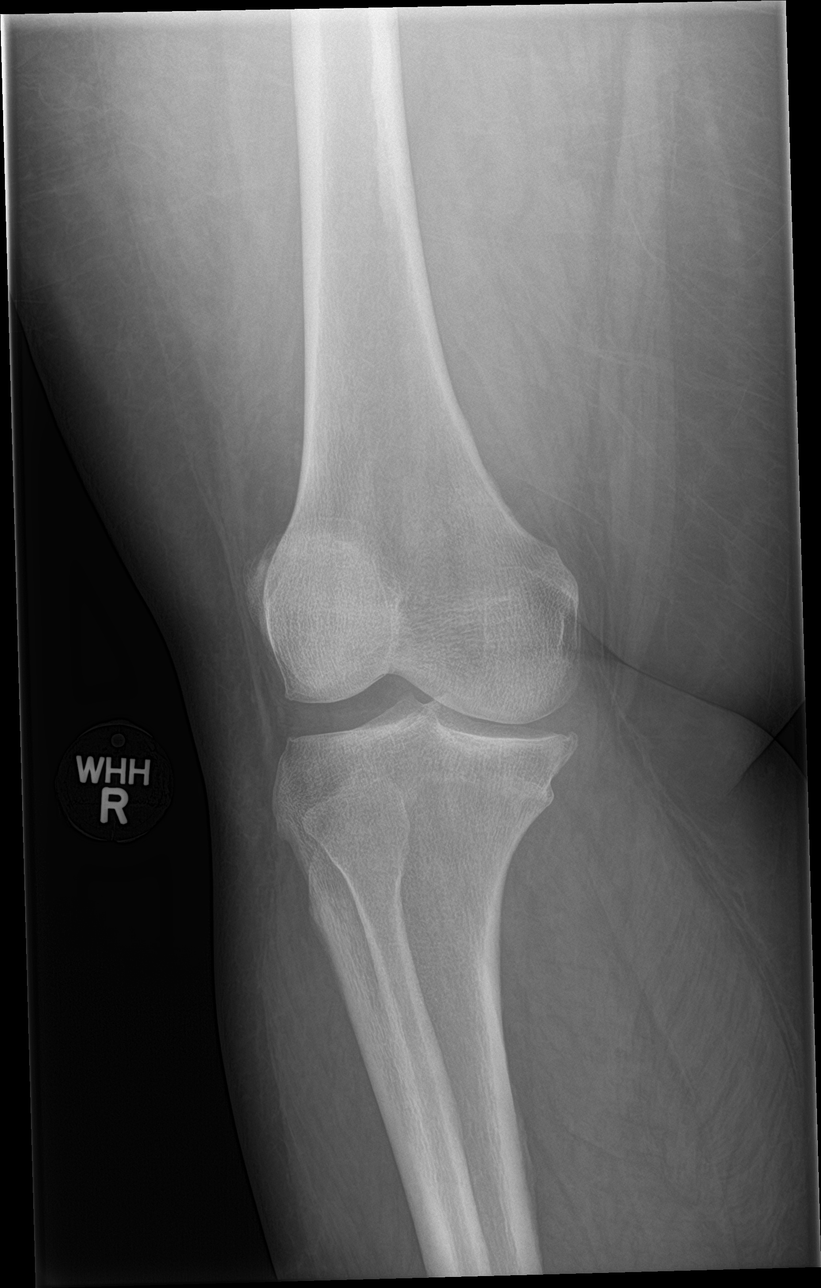

[knee ap (3 of 3)]
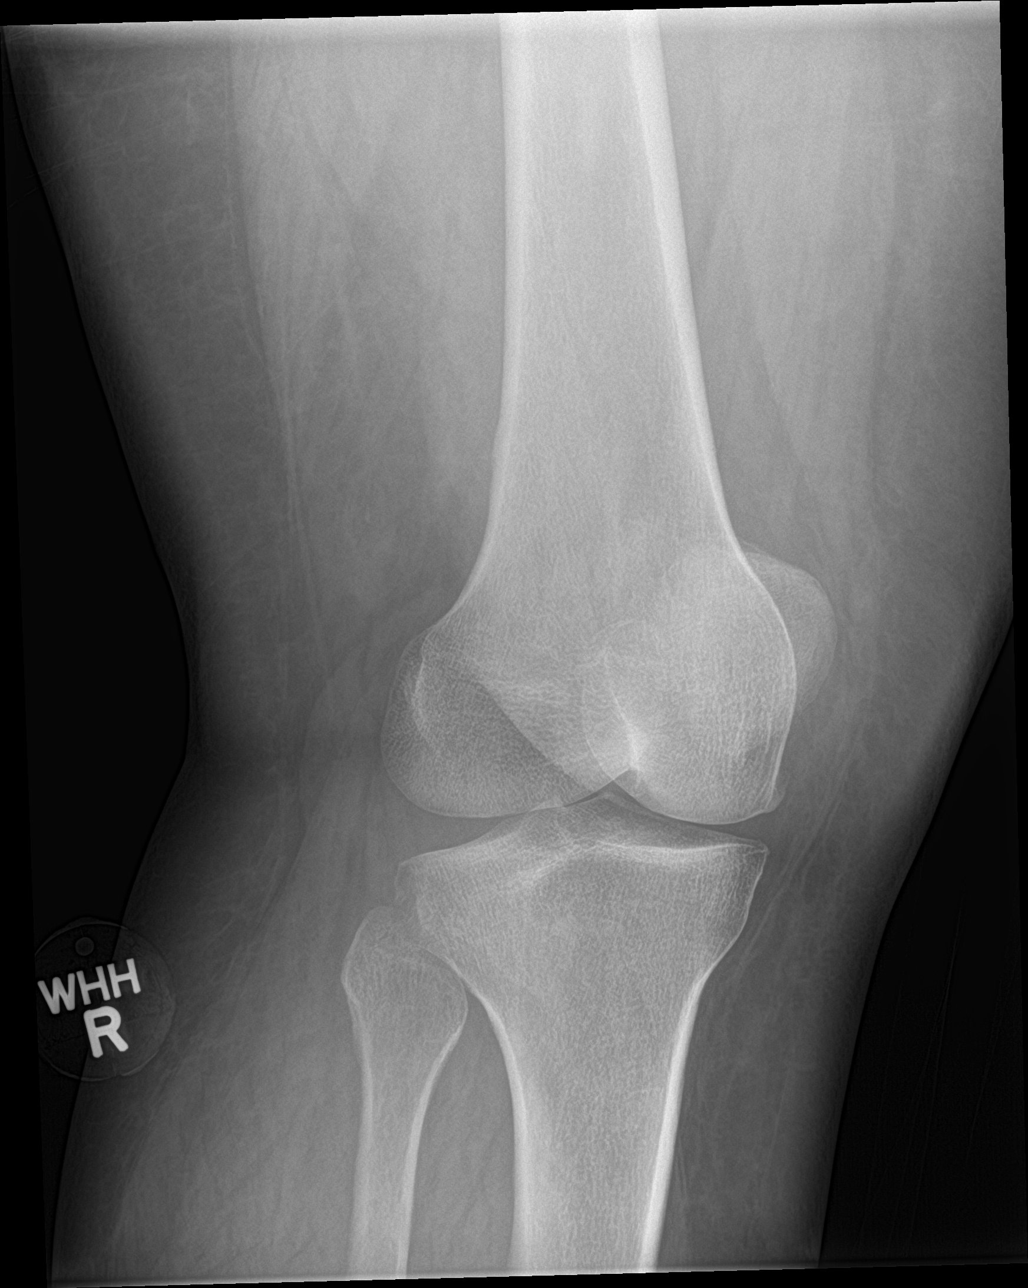

[4 of 4 positions shown; findings below may reference images not displayed]

FINDINGS: There is no evidence of fracture or dislocation. The joint spaces
are preserved. No significant degenerative change is seen; the
patellofemoral joint is grossly unremarkable in appearance.

No significant joint effusion is seen. The visualized soft tissues
are normal in appearance.
IMPRESSION: No evidence of fracture or dislocation.

## 2017-07-21 ENCOUNTER — Emergency Department (HOSPITAL_COMMUNITY)
Admission: EM | Admit: 2017-07-21 | Discharge: 2017-07-21 | Disposition: A | Discharge status: Home / Self Care | Payer: Medicaid Other | Attending: Emergency Medicine | Admitting: Emergency Medicine

## 2017-07-21 ENCOUNTER — Encounter (HOSPITAL_COMMUNITY): Payer: Self-pay | Admitting: Emergency Medicine

## 2017-07-21 DIAGNOSIS — D62 Acute posthemorrhagic anemia: Secondary | ICD-10-CM | POA: Diagnosis not present

## 2017-07-21 DIAGNOSIS — J45909 Unspecified asthma, uncomplicated: Secondary | ICD-10-CM | POA: Insufficient documentation

## 2017-07-21 DIAGNOSIS — N939 Abnormal uterine and vaginal bleeding, unspecified: Secondary | ICD-10-CM

## 2017-07-21 DIAGNOSIS — N938 Other specified abnormal uterine and vaginal bleeding: Secondary | ICD-10-CM | POA: Insufficient documentation

## 2017-07-21 DIAGNOSIS — Z79899 Other long term (current) drug therapy: Secondary | ICD-10-CM | POA: Insufficient documentation

## 2017-07-21 DIAGNOSIS — Z7982 Long term (current) use of aspirin: Secondary | ICD-10-CM | POA: Diagnosis not present

## 2017-07-21 DIAGNOSIS — I110 Hypertensive heart disease with heart failure: Secondary | ICD-10-CM | POA: Insufficient documentation

## 2017-07-21 DIAGNOSIS — I509 Heart failure, unspecified: Secondary | ICD-10-CM | POA: Diagnosis not present

## 2017-07-21 LAB — URINALYSIS, MICROSCOPIC (REFLEX)

## 2017-07-21 LAB — URINALYSIS, ROUTINE W REFLEX MICROSCOPIC
PH: 6.5 (ref 5.0–8.0)
SPECIFIC GRAVITY, URINE: 1.02 (ref 1.005–1.030)

## 2017-07-21 LAB — WET PREP, GENITAL
CLUE CELLS WET PREP: NONE SEEN
Sperm: NONE SEEN
Trich, Wet Prep: NONE SEEN
WBC WET PREP: NONE SEEN
YEAST WET PREP: NONE SEEN

## 2017-07-21 LAB — CBC WITH DIFFERENTIAL/PLATELET
BASOS PCT: 1 %
Basophils Absolute: 0 10*3/uL (ref 0.0–0.1)
EOS ABS: 0.2 10*3/uL (ref 0.0–0.7)
Eosinophils Relative: 3 %
HEMATOCRIT: 28.4 % — AB (ref 36.0–46.0)
Hemoglobin: 9 g/dL — ABNORMAL LOW (ref 12.0–15.0)
LYMPHS ABS: 2.1 10*3/uL (ref 0.7–4.0)
Lymphocytes Relative: 37 %
MCH: 27.3 pg (ref 26.0–34.0)
MCHC: 31.7 g/dL (ref 30.0–36.0)
MCV: 86.1 fL (ref 78.0–100.0)
MONO ABS: 0.6 10*3/uL (ref 0.1–1.0)
MONOS PCT: 10 %
NEUTROS ABS: 2.9 10*3/uL (ref 1.7–7.7)
Neutrophils Relative %: 49 %
Platelets: 284 10*3/uL (ref 150–400)
RBC: 3.3 MIL/uL — ABNORMAL LOW (ref 3.87–5.11)
RDW: 15.6 % — AB (ref 11.5–15.5)
WBC: 5.8 10*3/uL (ref 4.0–10.5)

## 2017-07-21 MED ORDER — MEGESTROL ACETATE 40 MG PO TABS: 40.0000 mg | ORAL_TABLET | Freq: Every day | ORAL | Status: DC

## 2017-07-21 MED ORDER — MEGESTROL ACETATE 40 MG PO TABS
120.0000 mg | ORAL_TABLET | Freq: Once | ORAL | Status: AC
  Administered 2017-07-21: 120 mg via ORAL
  Filled 2017-07-21 (×2): qty 3

## 2017-07-21 MED ORDER — SODIUM CHLORIDE 0.9 % IV BOLUS (SEPSIS)
1000.0000 mL | Freq: Once | INTRAVENOUS | Status: AC
  Administered 2017-07-21: 1000 mL via INTRAVENOUS

## 2017-07-21 MED ORDER — ESTROGENS CONJUGATED 25 MG IJ SOLR
25.0000 mg | Freq: Once | INTRAMUSCULAR | Status: AC
  Administered 2017-07-21: 25 mg via INTRAVENOUS
  Filled 2017-07-21 (×2): qty 25

## 2017-07-21 MED ORDER — MEGESTROL ACETATE 40 MG PO TABS: 120.0000 mg | ORAL_TABLET | Freq: Every day | ORAL | 0 refills | Status: DC

## 2017-07-21 NOTE — ED Triage Notes (Signed)
Pt c/o heavy vaginal bleeding x one week. Pt states she has been changing her feminine products every hour. Pt states her complexion is pale.

## 2017-07-21 NOTE — ED Notes (Signed)
Pt began to feel "tightness" in her chest shortly after IV Premarin finished. EKG obtained, lungs auscultated, and MD Mesner notified. Pt 100% on RA.

## 2017-07-21 NOTE — ED Provider Notes (Signed)
[facility] DEPT Provider Note   CSN: [id number] Arrival date & time: 07/21/17  1918     History   Chief Complaint Chief Complaint  Patient presents with  . Vaginal Bleeding    x 1 week  . Vaginal Bleeding    HPI [name] is a 51 y.o. female.  The history is provided by the patient.  Vaginal Bleeding  Primary symptoms include vaginal bleeding. There has been no fever. This is a new problem. The current episode started more than 2 days ago. The problem occurs constantly. The problem has been gradually worsening. She is not pregnant. She has missed her period. Her LMP was weeks ago. The patient's menstrual history has been regular. Pertinent negatives include no anorexia, no constipation, no diarrhea, no nausea, no vomiting, no light-headedness and no dizziness. She has tried nothing for the symptoms.    Past Medical History:  Diagnosis Date  . Anemia   . Asthma 12/20/2011  . Blood transfusion without reported diagnosis   . Bronchitis 12/19/2009  . CHF (congestive heart failure) (HCC)   . Chronic neck pain   . Hypertension   . Obesity   . Paresthesias     Patient Active Problem List   Diagnosis Date Noted  . Chest pain 11/12/2016  . Chronic systolic CHF (congestive heart failure) (HCC) 11/12/2016  . Hypokalemia 11/12/2016  . Hypertensive urgency 11/12/2016    Past Surgical History:  Procedure Laterality Date  . CESAREAN SECTION      OB History    Gravida Para Term Preterm AB Living   3 3 3     3    SAB TAB Ectopic Multiple Live Births                   Home Medications    Prior to Admission medications   Medication Sig Start Date End Date Taking? Authorizing Provider  albuterol-ipratropium (COMBIVENT) 18-103 MCG/ACT inhaler Inhale 2 puffs into the lungs every 6 (six) hours as needed for shortness of breath. 10/22/12  Yes Ivery Quale, PA-C  aspirin 81 MG chewable tablet Chew 81 mg by mouth daily.    Yes [provider]  benazepril  (LOTENSIN) 40 MG tablet Take 40 mg by mouth daily.    Yes [provider]  Fluticasone-Salmeterol (ADVAIR) 100-50 MCG/DOSE AEPB Inhale 1-2 puffs into the lungs 2 (two) times daily.   Yes [provider]  furosemide (LASIX) 40 MG tablet Take 40 mg by mouth 2 (two) times daily.    Yes [provider]  hydrALAZINE (APRESOLINE) 25 MG tablet Take 25 mg by mouth 2 (two) times daily.   Yes [provider]  labetalol (NORMODYNE) 300 MG tablet Take 300 mg by mouth 2 (two) times daily.   Yes [provider]  nitroGLYCERIN (NITROSTAT) 0.4 MG SL tablet Place 1 tablet (0.4 mg total) under the tongue every 5 (five) minutes x 3 doses as needed for chest pain. 11/13/16  Yes Houston Siren, MD  megestrol (MEGACE) 40 MG tablet Take 3 tablets (120 mg total) by mouth daily. Until directed otherwise by gynecologist 07/21/17   Annamaria Salah, Barbara Cower, MD    Family History Family History  Problem Relation Age of Onset  . Cancer Mother   . Heart attack Father   . Cancer Brother     Social History Social History  Substance Use Topics  . Smoking status: Never Smoker  . Smokeless tobacco: Never Used  . Alcohol use No     Allergies  Iodine   Review of Systems Review of Systems  Gastrointestinal: Negative for anorexia, constipation, diarrhea, nausea and vomiting.  Genitourinary: Positive for vaginal bleeding.  Neurological: Negative for dizziness and light-headedness.  All other systems reviewed and are negative.    Physical Exam Updated Vital Signs BP (!) 172/78   Pulse 72   Temp 98.4 F (36.9 C)   Resp (!) 25   Ht 5\' 3"  (1.6 m)   Wt 123.8 kg (273 lb)   SpO2 97%   BMI 48.36 kg/m   Physical Exam  Constitutional: She appears well-developed and well-nourished.  HENT:  Head: Normocephalic and atraumatic.  Eyes: Conjunctivae and EOM are normal.  Neck: Normal range of motion.  Cardiovascular: Normal rate and regular rhythm.   Pulmonary/Chest: No stridor. No  respiratory distress.  Abdominal: She exhibits no distension. There is no tenderness.  Genitourinary: There is bleeding in the vagina. No tenderness in the vagina. No foreign body in the vagina. No signs of injury around the vagina.  Neurological: She is alert.  Nursing note and vitals reviewed.    ED Treatments / Results  Labs (all labs ordered are listed, but only abnormal results are displayed) Labs Reviewed  CBC WITH DIFFERENTIAL/PLATELET - Abnormal; Notable for the following:       Result Value   RBC 3.30 (*)    Hemoglobin 9.0 (*)    HCT 28.4 (*)    RDW 15.6 (*)    All other components within normal limits  URINALYSIS, ROUTINE W REFLEX MICROSCOPIC - Abnormal; Notable for the following:    Color, Urine RED (*)    APPearance TURBID (*)    Glucose, UA   (*)    Value: TEST NOT REPORTED DUE TO COLOR INTERFERENCE OF URINE PIGMENT   Hgb urine dipstick   (*)    Value: TEST NOT REPORTED DUE TO COLOR INTERFERENCE OF URINE PIGMENT   Bilirubin Urine   (*)    Value: TEST NOT REPORTED DUE TO COLOR INTERFERENCE OF URINE PIGMENT   Ketones, ur   (*)    Value: TEST NOT REPORTED DUE TO COLOR INTERFERENCE OF URINE PIGMENT   Protein, ur   (*)    Value: TEST NOT REPORTED DUE TO COLOR INTERFERENCE OF URINE PIGMENT   Nitrite   (*)    Value: TEST NOT REPORTED DUE TO COLOR INTERFERENCE OF URINE PIGMENT   Leukocytes, UA   (*)    Value: TEST NOT REPORTED DUE TO COLOR INTERFERENCE OF URINE PIGMENT   All other components within normal limits  URINALYSIS, MICROSCOPIC (REFLEX) - Abnormal; Notable for the following:    Bacteria, UA FEW (*)    Squamous Epithelial / LPF 0-5 (*)    All other components within normal limits  WET PREP, GENITAL  RPR  HIV ANTIBODY (ROUTINE TESTING)  GC/CHLAMYDIA PROBE AMP ([place]) NOT AT [facility]    EKG  EKG Interpretation  Date/Time:  Friday July 21 2017 21:31:38 EDT Ventricular Rate:  73 PR Interval:    QRS Duration: 95 QT Interval:  396 QTC  Calculation: 437 R Axis:   -3 Text Interpretation:  Sinus rhythm Low voltage, extremity leads Nonspecific T abnormalities, lateral leads No significant change since last tracing Confirmed by Marily Memos [phone]) on 07/21/2017 9:58:43 PM       Radiology No results found.  Procedures Procedures (including critical care time)  Medications Ordered in ED Medications  conjugated estrogens (PREMARIN) injection 25 mg (25 mg Intravenous Given 07/21/17 2118)  sodium  chloride 0.9 % bolus 1,000 mL (0 mLs Intravenous Stopped 07/21/17 2218)  megestrol (MEGACE) tablet 120 mg (120 mg Oral Given 07/21/17 2117)     Initial Impression / Assessment and Plan / ED Course  I have reviewed the triage vital signs and the nursing notes.  Pertinent labs & imaging results that were available during my care of the patient were reviewed by me and considered in my medical decision making (see chart for details).     Vaginal bleeding with significant drop in hemoglobin but not symptomatic or hemodynamically unstable. However it is low enough she warrants hormonal cessation and close gyn follow up. Will call Dr. Despina Hidden for follow up appointment.  She had some chest discomfort and flushing after premarin injection, but seemed to improve. Suspect this was a side effect of medication.   Final Clinical Impressions(s) / ED Diagnoses   Final diagnoses:  Abnormal vaginal bleeding  Acute posthemorrhagic anemia    New Prescriptions Discharge Medication List as of 07/21/2017 10:05 PM    START taking these medications   Details  megestrol (MEGACE) 40 MG tablet Take 3 tablets (120 mg total) by mouth daily. Until directed otherwise by gynecologist, Starting Fri 07/21/2017, Print       1:37 PM Attempted to call and check on bleeding status, left voice mail, will call back later.      Marily Memos, MD 07/22/17 [phone]

## 2017-07-23 LAB — RPR: RPR: NONREACTIVE

## 2017-07-23 LAB — HIV ANTIBODY (ROUTINE TESTING W REFLEX): HIV SCREEN 4TH GENERATION: NONREACTIVE

## 2017-07-24 LAB — GC/CHLAMYDIA PROBE AMP (~~LOC~~) NOT AT ARMC
Chlamydia: NEGATIVE
NEISSERIA GONORRHEA: NEGATIVE

## 2017-08-04 ENCOUNTER — Encounter: Payer: Self-pay | Admitting: Adult Health

## 2017-08-04 ENCOUNTER — Ambulatory Visit (INDEPENDENT_AMBULATORY_CARE_PROVIDER_SITE_OTHER): Payer: Medicaid Other | Admitting: Adult Health

## 2017-08-04 VITALS — BP 160/92 | HR 80 | Ht 62.0 in | Wt 270.0 lb

## 2017-08-04 DIAGNOSIS — F4321 Adjustment disorder with depressed mood: Secondary | ICD-10-CM | POA: Insufficient documentation

## 2017-08-04 DIAGNOSIS — F4320 Adjustment disorder, unspecified: Secondary | ICD-10-CM | POA: Insufficient documentation

## 2017-08-04 DIAGNOSIS — D500 Iron deficiency anemia secondary to blood loss (chronic): Secondary | ICD-10-CM

## 2017-08-04 DIAGNOSIS — N939 Abnormal uterine and vaginal bleeding, unspecified: Secondary | ICD-10-CM | POA: Diagnosis not present

## 2017-08-04 DIAGNOSIS — N951 Menopausal and female climacteric states: Secondary | ICD-10-CM | POA: Insufficient documentation

## 2017-08-04 MED ORDER — FERROUS SULFATE 325 (65 FE) MG PO TABS: 325.0000 mg | ORAL_TABLET | Freq: Two times a day (BID) | ORAL | Status: DC

## 2017-08-04 MED ORDER — MEGESTROL ACETATE 40 MG PO TABS: ORAL_TABLET | ORAL | 0 refills | Status: DC

## 2017-08-04 MED ORDER — IRON 325 (65 FE) MG PO TABS: ORAL_TABLET | ORAL | 0 refills | Status: AC

## 2017-08-04 NOTE — Progress Notes (Signed)
Subjective:     Patient ID: [name], female   DOB: 1966/11/27, 51 y.o.   MRN: [id number]  HPI Chastin is a 51 year old black female, in for ER F/U of having bleeding and was seen 8/3 in ER at [facility] and started on megace 3 a day and bleeding has stopped, she is tired.Her periods have been regular, til missed several months ago and missed 2.She has had some hot flashes. She does not know when had pap last.GC/CHL was negative in ER and HGB was 9.  PCP is Dr Olena Leatherwood in [place].  Review of Systems Vaginal bleeding has stopped Tired Reviewed past medical,surgical, social and family history. Reviewed medications and allergies.     Objective:   Physical Exam BP (!) 160/92 (BP Location: Left Arm, Patient Position: Sitting, Cuff Size: Normal)   Pulse 80   Ht 5\' 2"  (1.575 m)   Wt 270 lb (122.5 kg)   BMI 49.38 kg/m  Skin warm and dry. Neck: mid line trachea, normal thyroid, good ROM, no lymphadenopathy noted. Lungs: clear to ausculation bilaterally. Cardiovascular: regular rate and rhythm. Pelvic: external genitalia is normal in appearance no lesions, vagina: good color, moisture and rugae,urethra has no lesions or masses noted, cervix:smooth and bulbous, uterus: normal size, shape and contour, non tender, no masses felt, adnexa: no masses or tenderness noted. Bladder is non tender and no masses felt.    PHQ 9 score 15, has just lost good friend, she declines meds.  Assessment:     1. Abnormal uterine bleeding (AUB)   2. Perimenopausal   3. Iron deficiency anemia due to chronic blood loss   4. Grief reaction       Plan:     Return in 1 week for [place] and see me Decrease megace to 2 daily for 5 days then 1 daily  Take 2 OTC iron table Eat greens and red meats  Review handout on anemia

## 2017-08-04 NOTE — Patient Instructions (Addendum)
Return in 1 week for [place] and see me Decrease megace to 2 daily for 5 days then 1 daily  Take 2 OTC iron table Eat greens and red meats  Anemia, Nonspecific Anemia is a condition in which the concentration of red blood cells or hemoglobin in the blood is below normal. Hemoglobin is a substance in red blood cells that carries oxygen to the tissues of the body. Anemia results in not enough oxygen reaching these tissues. What are the causes? Common causes of anemia include:  Excessive bleeding. Bleeding may be internal or external. This includes excessive bleeding from periods (in women) or from the intestine.  Poor nutrition.  Chronic kidney, thyroid, and liver disease.  Bone marrow disorders that decrease red blood cell production.  Cancer and treatments for cancer.  HIV, AIDS, and their treatments.  Spleen problems that increase red blood cell destruction.  Blood disorders.  Excess destruction of red blood cells due to infection, medicines, and autoimmune disorders.  What are the signs or symptoms?  Minor weakness.  Dizziness.  Headache.  Palpitations.  Shortness of breath, especially with exercise.  Paleness.  Cold sensitivity.  Indigestion.  Nausea.  Difficulty sleeping.  Difficulty concentrating. Symptoms may occur suddenly or they may develop slowly. How is this diagnosed? Additional blood tests are often needed. These help your health care provider determine the best treatment. Your health care provider will check your stool for blood and look for other causes of blood loss. How is this treated? Treatment varies depending on the cause of the anemia. Treatment can include:  Supplements of iron, vitamin B12, or folic acid.  Hormone medicines.  A blood transfusion. This may be needed if blood loss is severe.  Hospitalization. This may be needed if there is significant continual blood loss.  Dietary changes.  Spleen removal.  Follow these  instructions at home: Keep all follow-up appointments. It often takes many weeks to correct anemia, and having your health care provider check on your condition and your response to treatment is very important. Get help right away if:  You develop extreme weakness, shortness of breath, or chest pain.  You become dizzy or have trouble concentrating.  You develop heavy vaginal bleeding.  You develop a rash.  You have bloody or black, tarry stools.  You faint.  You vomit up blood.  You vomit repeatedly.  You have abdominal pain.  You have a fever or persistent symptoms for more than 2-3 days.  You have a fever and your symptoms suddenly get worse.  You are dehydrated. This information is not intended to replace advice given to you by your health care provider. Make sure you discuss any questions you have with your health care provider. Document Released: 01/12/2005 Document Revised: 05/18/2016 Document Reviewed: 05/31/2013 Elsevier Interactive Patient Education  2017 [employer].

## 2017-08-09 ENCOUNTER — Ambulatory Visit (INDEPENDENT_AMBULATORY_CARE_PROVIDER_SITE_OTHER): Payer: Medicaid Other | Admitting: Adult Health

## 2017-08-09 ENCOUNTER — Ambulatory Visit (INDEPENDENT_AMBULATORY_CARE_PROVIDER_SITE_OTHER): Payer: Medicaid Other

## 2017-08-09 ENCOUNTER — Encounter: Payer: Self-pay | Admitting: Adult Health

## 2017-08-09 VITALS — BP 140/70 | HR 74 | Ht 62.0 in | Wt 273.5 lb

## 2017-08-09 DIAGNOSIS — N939 Abnormal uterine and vaginal bleeding, unspecified: Secondary | ICD-10-CM | POA: Diagnosis not present

## 2017-08-09 DIAGNOSIS — R938 Abnormal findings on diagnostic imaging of other specified body structures: Secondary | ICD-10-CM | POA: Diagnosis not present

## 2017-08-09 DIAGNOSIS — R9389 Abnormal findings on diagnostic imaging of other specified body structures: Secondary | ICD-10-CM

## 2017-08-09 DIAGNOSIS — N951 Menopausal and female climacteric states: Secondary | ICD-10-CM | POA: Diagnosis not present

## 2017-08-09 DIAGNOSIS — D500 Iron deficiency anemia secondary to blood loss (chronic): Secondary | ICD-10-CM | POA: Diagnosis not present

## 2017-08-09 NOTE — Patient Instructions (Addendum)
Return for endo biopsy with Dr Emelda Fear in about a week Keep taking iron and megace

## 2017-08-09 NOTE — Progress Notes (Signed)
PELVIC US TA/TV:heterogeneous anteverted uterus, 1 x  .9 x .9 cm complex nabothian cyst,normal ovaries bilat (limited view),no free fluid,complex thickened endometrium 27 mm,

## 2017-08-09 NOTE — Progress Notes (Signed)
Subjective:     Patient ID: [name], female   DOB: 05-09-1966, 51 y.o.   MRN: [id number]  HPI [name] is a 51 year old black female in to review [place] for AUB.Bleeding has stopped on megace, feels cold, is eating red meat and taking iron.  Review of Systems Bleeding has stopped Feels cold Reviewed past medical,surgical, social and family history. Reviewed medications and allergies.     Objective:   Physical Exam BP 140/70 (BP Location: Left Arm, Patient Position: Sitting, Cuff Size: Large)   Pulse 74   Ht 5\' 2"  (1.575 m)   Wt 273 lb 8 oz (124.1 kg)   LMP  (LMP Unknown)   BMI 50.02 kg/m US showed complex thickened endometrium 27 mm and complex nabothian cyst of cervix and normal ovaries, limited view and heterogeneous anteverted uterus. Discussed with Dr Emelda Fear will get endometrial biopsy.Pt aware needs biopsy and she agrees.     Assessment:     1. Thickened endometrium       Plan:     Continue iron and megace Return in about week for endometrial biopsy with Dr Emelda Fear and check HGB then

## 2017-08-18 ENCOUNTER — Other Ambulatory Visit: Payer: Self-pay | Admitting: Obstetrics and Gynecology

## 2017-08-18 ENCOUNTER — Ambulatory Visit (INDEPENDENT_AMBULATORY_CARE_PROVIDER_SITE_OTHER): Payer: Medicaid Other | Admitting: Obstetrics and Gynecology

## 2017-08-18 ENCOUNTER — Encounter: Payer: Self-pay | Admitting: Obstetrics and Gynecology

## 2017-08-18 VITALS — BP 116/64 | HR 78 | Ht 62.0 in | Wt 278.5 lb

## 2017-08-18 DIAGNOSIS — N939 Abnormal uterine and vaginal bleeding, unspecified: Secondary | ICD-10-CM | POA: Diagnosis not present

## 2017-08-18 DIAGNOSIS — N854 Malposition of uterus: Secondary | ICD-10-CM

## 2017-08-18 DIAGNOSIS — R938 Abnormal findings on diagnostic imaging of other specified body structures: Secondary | ICD-10-CM

## 2017-08-18 DIAGNOSIS — Z3202 Encounter for pregnancy test, result negative: Secondary | ICD-10-CM | POA: Diagnosis not present

## 2017-08-18 DIAGNOSIS — R9389 Abnormal findings on diagnostic imaging of other specified body structures: Secondary | ICD-10-CM

## 2017-08-18 DIAGNOSIS — D649 Anemia, unspecified: Secondary | ICD-10-CM

## 2017-08-18 LAB — POCT HEMOGLOBIN: HEMOGLOBIN: 9.1 g/dL — AB (ref 12.2–16.2)

## 2017-08-18 LAB — POCT URINE PREGNANCY: Preg Test, Ur: NEGATIVE

## 2017-08-18 NOTE — Progress Notes (Signed)
[name] is a 51 y.o. female here for endometrial biopsy, due to thickened endometrium and AUB. PT denies recent bleeding.    Endometrial Biopsy: Patient given informed consent, signed copy in the chart, time out was performed. Time out taken. The patient was placed in the lithotomy position and the cervix brought into view with sterile speculum.  Portio of cervix cleansed x 2 with betadine swabs.  A tenaculum was placed in the anterior lip of the cervix. The uterus was sounded for depth of 10 cm,. Milex uterine Explora 3 mm was introduced to into the uterus, suction created,  and an endometrial sample was obtained. All equipment was removed and accounted for.   The patient tolerated the procedure well.  3 cc of tissue removed with difficulty from anteverted uterus. That required an large snowman speculum placed upside down in order to visualize the cervix   Patient given post procedure instructions.  Followup: in 2 weeks, Pt will be called to discuss results in about 1 week    By signing my name below, I, Izna Ahmed, attest that this documentation has been prepared under the direction and in the presence of Tilda Burrow, MD. Electronically Signed: Redge Gainer, Medical Scribe. 08/18/17. 10:56 AM.  I personally performed the services described in this documentation, which was SCRIBED in my presence. The recorded information has been reviewed and considered accurate. It has been edited as necessary during review. Tilda Burrow, MD

## 2017-08-22 ENCOUNTER — Telehealth: Payer: Self-pay | Admitting: *Deleted

## 2017-08-22 NOTE — Telephone Encounter (Signed)
Called patient to inform her of Hgb 9.1 and need to continue iron supplementation and to increase diet in green leafy vegetables, nuts, beans, red meat in preparation for possible surgery.  Verbalized understanding.

## 2017-08-23 ENCOUNTER — Telehealth: Payer: Self-pay | Admitting: Obstetrics & Gynecology

## 2017-08-23 NOTE — Telephone Encounter (Signed)
Pt called requesting results of biopsy. Informed pt that I would send her request to a provider and she should hear from [place] in the next few days. Pt verbalized understanding.

## 2017-08-24 NOTE — Telephone Encounter (Signed)
Endometrial biopsy: benign,  Pt made aware. Pt advised to d/c Megace.  Pt experiencing lite bleeding since  Biopsy, considered wnl.

## 2017-08-28 ENCOUNTER — Encounter: Payer: Self-pay | Admitting: Obstetrics and Gynecology

## 2017-08-30 ENCOUNTER — Encounter (HOSPITAL_COMMUNITY): Payer: Self-pay | Admitting: Emergency Medicine

## 2017-08-30 ENCOUNTER — Telehealth: Payer: Self-pay | Admitting: Obstetrics & Gynecology

## 2017-08-30 ENCOUNTER — Emergency Department (HOSPITAL_COMMUNITY)
Admission: EM | Admit: 2017-08-30 | Discharge: 2017-08-30 | Disposition: A | Discharge status: Home / Self Care | Payer: Medicaid Other | Attending: Emergency Medicine | Admitting: Emergency Medicine

## 2017-08-30 DIAGNOSIS — N939 Abnormal uterine and vaginal bleeding, unspecified: Secondary | ICD-10-CM | POA: Diagnosis present

## 2017-08-30 DIAGNOSIS — J45909 Unspecified asthma, uncomplicated: Secondary | ICD-10-CM | POA: Diagnosis not present

## 2017-08-30 DIAGNOSIS — I110 Hypertensive heart disease with heart failure: Secondary | ICD-10-CM | POA: Insufficient documentation

## 2017-08-30 DIAGNOSIS — Z7982 Long term (current) use of aspirin: Secondary | ICD-10-CM | POA: Diagnosis not present

## 2017-08-30 DIAGNOSIS — I5022 Chronic systolic (congestive) heart failure: Secondary | ICD-10-CM | POA: Insufficient documentation

## 2017-08-30 DIAGNOSIS — R231 Pallor: Secondary | ICD-10-CM | POA: Diagnosis not present

## 2017-08-30 DIAGNOSIS — Z79899 Other long term (current) drug therapy: Secondary | ICD-10-CM | POA: Insufficient documentation

## 2017-08-30 LAB — CBC WITH DIFFERENTIAL/PLATELET
Basophils Absolute: 0 10*3/uL (ref 0.0–0.1)
Basophils Relative: 0 %
EOS ABS: 0 10*3/uL (ref 0.0–0.7)
EOS PCT: 1 %
HCT: 32.9 % — ABNORMAL LOW (ref 36.0–46.0)
Hemoglobin: 10.1 g/dL — ABNORMAL LOW (ref 12.0–15.0)
LYMPHS ABS: 0.8 10*3/uL (ref 0.7–4.0)
LYMPHS PCT: 14 %
MCH: 26 pg (ref 26.0–34.0)
MCHC: 30.7 g/dL (ref 30.0–36.0)
MCV: 84.6 fL (ref 78.0–100.0)
MONO ABS: 0.5 10*3/uL (ref 0.1–1.0)
MONOS PCT: 10 %
Neutro Abs: 4 10*3/uL (ref 1.7–7.7)
Neutrophils Relative %: 75 %
PLATELETS: 438 10*3/uL — AB (ref 150–400)
RBC: 3.89 MIL/uL (ref 3.87–5.11)
RDW: 16.6 % — ABNORMAL HIGH (ref 11.5–15.5)
WBC: 5.3 10*3/uL (ref 4.0–10.5)

## 2017-08-30 LAB — BASIC METABOLIC PANEL
Anion gap: 7 (ref 5–15)
BUN: 7 mg/dL (ref 6–20)
CALCIUM: 8.9 mg/dL (ref 8.9–10.3)
CO2: 25 mmol/L (ref 22–32)
CREATININE: 0.98 mg/dL (ref 0.44–1.00)
Chloride: 103 mmol/L (ref 101–111)
GFR calc Af Amer: >60 mL/min (ref >60)
GLUCOSE: 96 mg/dL (ref 65–99)
POTASSIUM: 3.4 mmol/L — AB (ref 3.5–5.1)
SODIUM: 135 mmol/L (ref 135–145)

## 2017-08-30 LAB — TYPE AND SCREEN
ABO/RH(D): A POS
Antibody Screen: NEGATIVE

## 2017-08-30 MED ORDER — NAPROXEN 500 MG PO TABS: 500.0000 mg | ORAL_TABLET | Freq: Two times a day (BID) | ORAL | 0 refills | Status: DC

## 2017-08-30 MED ORDER — NAPROXEN 250 MG PO TABS
500.0000 mg | ORAL_TABLET | Freq: Once | ORAL | Status: AC
  Administered 2017-08-30: 500 mg via ORAL
  Filled 2017-08-30: qty 2

## 2017-08-30 MED ORDER — MEGESTROL ACETATE 40 MG PO TABS: ORAL_TABLET | ORAL | 0 refills | Status: DC

## 2017-08-30 NOTE — ED Provider Notes (Signed)
[facility] DE[facility] Provider Note   CSN: [id number] Arrival date & time: 08/30/17  1656     History   Chief Complaint Chief Complaint  Patient presents with  . Vaginal Bleeding    HPI KRISHONDA LOEFFEL is a 51 y.o. female.  HPI Patient presents to the emergency room for evaluation of recurrent vaginal bleeding. Patient states she started having heavy vaginal bleeding around the end of August. She was told she was anemic. She followed up with a gynecologist's, Dr. Bridgette Habermann. She was given a prescription for megace.  Patient states her vaginal bleeding eventually stopped. The last week or so however she started having recurrent bleeding. Yesterday she started having heavier bleeding. She has intermittent lower abdominal cramping that feels like contractions. She's had to change the pad 3 times in the last hour. She denies any trouble with dizziness, chest pain, shortness of breath or weakness. She tried to call her gynecologist's office earlier this week and was told she was would get a call back. When she did not hear back from him and as her symptoms started to worsen she decided to come to the emergency room Past Medical History:  Diagnosis Date  . Anemia   . Asthma 12/20/2011  . Blood transfusion without reported diagnosis   . Bronchitis 12/19/2009  . CHF (congestive heart failure) (HCC)   . Chronic neck pain   . Hypertension   . Obesity   . Paresthesias     Patient Active Problem List   Diagnosis Date Noted  . Iron deficiency anemia due to chronic blood loss 08/04/2017  . Perimenopausal 08/04/2017  . Abnormal uterine bleeding (AUB) 08/04/2017  . Grief reaction 08/04/2017  . Chest pain 11/12/2016  . Chronic systolic CHF (congestive heart failure) (HCC) 11/12/2016  . Hypokalemia 11/12/2016  . Hypertensive urgency 11/12/2016    Past Surgical History:  Procedure Laterality Date  . CESAREAN SECTION      OB History    Gravida Para Term Preterm AB Living   3 3 3     3    SAB  TAB Ectopic Multiple Live Births           3       Home Medications    Prior to Admission medications   Medication Sig Start Date End Date Taking? Authorizing Provider  aspirin 81 MG chewable tablet Chew 81 mg by mouth daily.    Yes [provider]  benazepril (LOTENSIN) 40 MG tablet Take 40 mg by mouth daily.    Yes [provider]  Ferrous Sulfate (IRON) 325 (65 Fe) MG TABS Take 2 daily Patient taking differently: Take 650 mg by mouth daily.  08/04/17  Yes Cyril Mourning A, NP  Fluticasone-Salmeterol (ADVAIR) 100-50 MCG/DOSE AEPB Inhale 1-2 puffs into the lungs 2 (two) times daily.   Yes [provider]  furosemide (LASIX) 40 MG tablet Take 40 mg by mouth 2 (two) times daily.    Yes [provider]  hydrALAZINE (APRESOLINE) 25 MG tablet Take 25 mg by mouth 2 (two) times daily.   Yes [provider]  labetalol (NORMODYNE) 300 MG tablet Take 300 mg by mouth 2 (two) times daily.   Yes [provider]  nitroGLYCERIN (NITROSTAT) 0.4 MG SL tablet Place 1 tablet (0.4 mg total) under the tongue every 5 (five) minutes x 3 doses as needed for chest pain. 11/13/16  Yes Houston Siren, MD  megestrol (MEGACE) 40 MG tablet Take three tabs per day for 5 days, then  2 tabs per day for 5 days, then take 1 tab per day 08/30/17   Linwood Dibbles, MD  naproxen (NAPROSYN) 500 MG tablet Take 1 tablet (500 mg total) by mouth 2 (two) times daily. 08/30/17   Linwood Dibbles, MD    Family History Family History  Problem Relation Age of Onset  . Cancer Mother   . Other Mother        has a pacemaker  . Heart attack Father   . Other Father        was on dialysis  . Hypertension Brother   . Hypertension Brother     Social History Social History  Substance Use Topics  . Smoking status: Never Smoker  . Smokeless tobacco: Never Used  . Alcohol use No     Allergies   Iodine   Review of Systems Review of Systems  All other systems reviewed and are  negative.    Physical Exam Updated Vital Signs BP (!) 170/86 (BP Location: Right Arm)   Pulse 77   Temp 98.2 F (36.8 C) (Oral)   Resp 18   Ht 1.575 m ( )   Wt 126.1 kg (278 lb)   LMP  (LMP Unknown)   SpO2 100%   BMI 50.85 kg/m   Physical Exam  Constitutional: She appears well-developed and well-nourished. No distress.  HENT:  Head: Normocephalic and atraumatic.  Right Ear: External ear normal.  Left Ear: External ear normal.  Eyes: Conjunctivae are normal. Right eye exhibits no discharge. Left eye exhibits no discharge. No scleral icterus.  Neck: Neck supple. No tracheal deviation present.  Cardiovascular: Normal rate, regular rhythm and intact distal pulses.   Pulmonary/Chest: Effort normal and breath sounds normal. No stridor. No respiratory distress. She has no wheezes. She has no rales.  Abdominal: Soft. Bowel sounds are normal. She exhibits no distension. There is no tenderness. There is no rebound and no guarding.  Musculoskeletal: She exhibits no edema or tenderness.  Neurological: She is alert. She has normal strength. No cranial nerve deficit (no facial droop, extraocular movements intact, no slurred speech) or sensory deficit. She exhibits normal muscle tone. She displays no seizure activity. Coordination normal.  Skin: Skin is warm and dry. No rash noted. There is pallor.  Psychiatric: She has a normal mood and affect.  Nursing note and vitals reviewed.    ED Treatments / Results  Labs (all labs ordered are listed, but only abnormal results are displayed) Labs Reviewed  CBC WITH DIFFERENTIAL/PLATELET - Abnormal; Notable for the following:       Result Value   Hemoglobin 10.1 (*)    HCT 32.9 (*)    RDW 16.6 (*)    Platelets 438 (*)    All other components within normal limits  BASIC METABOLIC PANEL - Abnormal; Notable for the following:    Potassium 3.4 (*)    All other components within normal limits  TYPE AND SCREEN     Procedures Procedures  (including critical care time)  Medications Ordered in ED Medications  naproxen (NAPROSYN) tablet 500 mg (not administered)     Initial Impression / Assessment and Plan / ED Course  I have reviewed the triage vital signs and the nursing notes.  Pertinent labs & imaging results that were available during my care of the patient were reviewed by me and considered in my medical decision making (see chart for details).   Pt presents with recurrent vaginal bleeding.  Anemia is stable.  Vitals are stable.  Discussed with Dr Despina Hidden.  Will dc home and have her restart megace.  Follow up in the office  Final Clinical Impressions(s) / ED Diagnoses   Final diagnoses:  Abnormal uterine bleeding (AUB)    New Prescriptions New Prescriptions   MEGESTROL (MEGACE) 40 MG TABLET    Take three tabs per day for 5 days, then 2 tabs per day for 5 days, then take 1 tab per day   NAPROXEN (NAPROSYN) 500 MG TABLET    Take 1 tablet (500 mg total) by mouth 2 (two) times daily.     Linwood Dibbles, MD 08/30/17 2214

## 2017-08-30 NOTE — Telephone Encounter (Signed)
Patient called crying with complaints of continued heavy bleeding along with significant cramping stating "I feel like I'm having a baby". She is returning in 3 months for a follow-up but doesn't know what to do until then. Please advise.

## 2017-08-30 NOTE — Discharge Instructions (Signed)
Start taking the Megace again, follow-up with your OB/GYN doctor

## 2017-08-30 NOTE — ED Triage Notes (Addendum)
Pt reports history of same. Pt reports was seen for same at the end of august. Pt reports was given methotrexate at that time to "Stop period". Pt reports "period returned yesterday." pt reports is currently iron supplements for anemia. Pt reports lower abd pain at this time. Pt reports has changed pad x3 in the last hour.  Pt denies any dizziness, chest pain,shortness of breath, or headache.

## 2017-09-05 NOTE — Telephone Encounter (Signed)
Please make f/u appt asap

## 2017-09-06 NOTE — Telephone Encounter (Signed)
Spoke to patient and informed her Dr Emelda Fear would like to follow-up with her ASAP. Pt stated she could come next Friday. Appt made.

## 2017-09-15 ENCOUNTER — Ambulatory Visit (INDEPENDENT_AMBULATORY_CARE_PROVIDER_SITE_OTHER): Payer: Medicaid Other | Admitting: Obstetrics and Gynecology

## 2017-09-15 DIAGNOSIS — Z91199 Patient's noncompliance with other medical treatment and regimen due to unspecified reason: Secondary | ICD-10-CM

## 2017-09-15 DIAGNOSIS — Z5329 Procedure and treatment not carried out because of patient's decision for other reasons: Secondary | ICD-10-CM

## 2017-09-19 NOTE — Progress Notes (Signed)
Apparent no show. 

## 2017-10-23 ENCOUNTER — Emergency Department (HOSPITAL_COMMUNITY)
Admission: EM | Admit: 2017-10-23 | Discharge: 2017-10-23 | Discharge status: Against Medical Advice | Payer: Medicaid Other

## 2017-10-23 NOTE — ED Notes (Signed)
Registration staff states pt checked in and saw waiting room then left. Pt states she wasn't staying.

## 2017-11-27 ENCOUNTER — Ambulatory Visit: Payer: Self-pay | Admitting: Obstetrics and Gynecology

## 2018-01-03 ENCOUNTER — Emergency Department (HOSPITAL_COMMUNITY): Payer: Medicaid Other

## 2018-01-03 ENCOUNTER — Emergency Department (HOSPITAL_COMMUNITY)
Admission: EM | Admit: 2018-01-03 | Discharge: 2018-01-03 | Disposition: A | Discharge status: Home / Self Care | Payer: Medicaid Other | Attending: Emergency Medicine | Admitting: Emergency Medicine

## 2018-01-03 ENCOUNTER — Other Ambulatory Visit: Payer: Self-pay

## 2018-01-03 ENCOUNTER — Encounter (HOSPITAL_COMMUNITY): Payer: Self-pay | Admitting: Emergency Medicine

## 2018-01-03 DIAGNOSIS — I110 Hypertensive heart disease with heart failure: Secondary | ICD-10-CM | POA: Diagnosis not present

## 2018-01-03 DIAGNOSIS — R109 Unspecified abdominal pain: Secondary | ICD-10-CM | POA: Diagnosis present

## 2018-01-03 DIAGNOSIS — Z79899 Other long term (current) drug therapy: Secondary | ICD-10-CM | POA: Insufficient documentation

## 2018-01-03 DIAGNOSIS — J45909 Unspecified asthma, uncomplicated: Secondary | ICD-10-CM | POA: Insufficient documentation

## 2018-01-03 DIAGNOSIS — Z7982 Long term (current) use of aspirin: Secondary | ICD-10-CM | POA: Diagnosis not present

## 2018-01-03 DIAGNOSIS — R112 Nausea with vomiting, unspecified: Secondary | ICD-10-CM | POA: Diagnosis not present

## 2018-01-03 DIAGNOSIS — I10 Essential (primary) hypertension: Secondary | ICD-10-CM

## 2018-01-03 DIAGNOSIS — N3090 Cystitis, unspecified without hematuria: Secondary | ICD-10-CM

## 2018-01-03 DIAGNOSIS — I5022 Chronic systolic (congestive) heart failure: Secondary | ICD-10-CM | POA: Insufficient documentation

## 2018-01-03 LAB — CBC WITH DIFFERENTIAL/PLATELET
Basophils Absolute: 0 10*3/uL (ref 0.0–0.1)
Basophils Relative: 0 %
EOS ABS: 0.1 10*3/uL (ref 0.0–0.7)
EOS PCT: 2 %
HCT: 41.3 % (ref 36.0–46.0)
Hemoglobin: 12.9 g/dL (ref 12.0–15.0)
LYMPHS ABS: 1.2 10*3/uL (ref 0.7–4.0)
Lymphocytes Relative: 22 %
MCH: 27.6 pg (ref 26.0–34.0)
MCHC: 31.2 g/dL (ref 30.0–36.0)
MCV: 88.2 fL (ref 78.0–100.0)
MONO ABS: 0.5 10*3/uL (ref 0.1–1.0)
MONOS PCT: 9 %
Neutro Abs: 3.5 10*3/uL (ref 1.7–7.7)
Neutrophils Relative %: 67 %
PLATELETS: 296 10*3/uL (ref 150–400)
RBC: 4.68 MIL/uL (ref 3.87–5.11)
RDW: 15.8 % — ABNORMAL HIGH (ref 11.5–15.5)
WBC: 5.3 10*3/uL (ref 4.0–10.5)

## 2018-01-03 LAB — URINALYSIS, ROUTINE W REFLEX MICROSCOPIC
Bilirubin Urine: NEGATIVE
Glucose, UA: 50 mg/dL — AB
Hgb urine dipstick: NEGATIVE
Ketones, ur: NEGATIVE mg/dL
Leukocytes, UA: NEGATIVE
NITRITE: NEGATIVE
PROTEIN: NEGATIVE mg/dL
SPECIFIC GRAVITY, URINE: 1.01 (ref 1.005–1.030)
pH: 8 (ref 5.0–8.0)

## 2018-01-03 LAB — COMPREHENSIVE METABOLIC PANEL
ALK PHOS: 45 U/L (ref 38–126)
ALT: 14 U/L (ref 14–54)
ANION GAP: 12 (ref 5–15)
AST: 18 U/L (ref 15–41)
Albumin: 3.9 g/dL (ref 3.5–5.0)
BUN: 15 mg/dL (ref 6–20)
CALCIUM: 9.9 mg/dL (ref 8.9–10.3)
CHLORIDE: 102 mmol/L (ref 101–111)
CO2: 26 mmol/L (ref 22–32)
Creatinine, Ser: 1.09 mg/dL — ABNORMAL HIGH (ref 0.44–1.00)
GFR calc non Af Amer: 57 mL/min — ABNORMAL LOW (ref >60)
Glucose, Bld: 146 mg/dL — ABNORMAL HIGH (ref 65–99)
Potassium: 3.7 mmol/L (ref 3.5–5.1)
Sodium: 140 mmol/L (ref 135–145)
Total Bilirubin: 0.9 mg/dL (ref 0.3–1.2)
Total Protein: 8 g/dL (ref 6.5–8.1)

## 2018-01-03 LAB — LIPASE, BLOOD: Lipase: 26 U/L (ref 11–51)

## 2018-01-03 MED ORDER — LABETALOL HCL 200 MG PO TABS
300.0000 mg | ORAL_TABLET | Freq: Once | ORAL | Status: AC
  Administered 2018-01-03: 300 mg via ORAL
  Filled 2018-01-03: qty 2

## 2018-01-03 MED ORDER — OXYCODONE-ACETAMINOPHEN 5-325 MG PO TABS
1.0000 | ORAL_TABLET | Freq: Once | ORAL | Status: AC
  Administered 2018-01-03: 1 via ORAL
  Filled 2018-01-03: qty 1

## 2018-01-03 MED ORDER — BENAZEPRIL HCL 40 MG PO TABS
40.0000 mg | ORAL_TABLET | Freq: Every day | ORAL | Status: DC
  Filled 2018-01-03: qty 1

## 2018-01-03 MED ORDER — CEPHALEXIN 500 MG PO CAPS: 500.0000 mg | ORAL_CAPSULE | Freq: Two times a day (BID) | ORAL | 0 refills | Status: DC

## 2018-01-03 MED ORDER — HYDROMORPHONE HCL 1 MG/ML IJ SOLN
1.0000 mg | Freq: Once | INTRAMUSCULAR | Status: AC
  Administered 2018-01-03: 1 mg via INTRAVENOUS
  Filled 2018-01-03: qty 1

## 2018-01-03 MED ORDER — HYDRALAZINE HCL 25 MG PO TABS
25.0000 mg | ORAL_TABLET | Freq: Once | ORAL | Status: AC
  Administered 2018-01-03: 25 mg via ORAL
  Filled 2018-01-03: qty 1

## 2018-01-03 MED ORDER — ONDANSETRON HCL 4 MG/2ML IJ SOLN
4.0000 mg | Freq: Once | INTRAMUSCULAR | Status: AC
  Administered 2018-01-03: 4 mg via INTRAVENOUS
  Filled 2018-01-03: qty 2

## 2018-01-03 NOTE — ED Provider Notes (Signed)
[facility] EMERGENCY DEPARTMENT Provider Note   CSN: [id number] Arrival date & time: 01/03/18  0436     History   Chief Complaint Chief Complaint  Patient presents with  . Abdominal Pain    HPI [name] is a 52 y.o. female.  Patient presents to the emergency department for evaluation of abdominal pain.  Patient reports that she was awakened from sleep at 1 AM with left-sided abdominal pain.  Pain is constant and severe, radiates around to the left flank.  It is accompanied by nausea and vomiting.  She has not had any fever or diarrhea.  She denies urinary symptoms.  She has never had similar symptoms in the past.      Past Medical History:  Diagnosis Date  . Anemia   . Asthma 12/20/2011  . Blood transfusion without reported diagnosis   . Bronchitis 12/19/2009  . CHF (congestive heart failure) (HCC)   . Chronic neck pain   . Hypertension   . Obesity   . Paresthesias     Patient Active Problem List   Diagnosis Date Noted  . Iron deficiency anemia due to chronic blood loss 08/04/2017  . Perimenopausal 08/04/2017  . Abnormal uterine bleeding (AUB) 08/04/2017  . Grief reaction 08/04/2017  . Chest pain 11/12/2016  . Chronic systolic CHF (congestive heart failure) (HCC) 11/12/2016  . Hypokalemia 11/12/2016  . Hypertensive urgency 11/12/2016    Past Surgical History:  Procedure Laterality Date  . CESAREAN SECTION      OB History    Gravida Para Term Preterm AB Living   3 3 3     3    SAB TAB Ectopic Multiple Live Births           3       Home Medications    Prior to Admission medications   Medication Sig Start Date End Date Taking? Authorizing Provider  aspirin 81 MG chewable tablet Chew 81 mg by mouth daily.     [provider]  benazepril (LOTENSIN) 40 MG tablet Take 40 mg by mouth daily.     [provider]  cephALEXin (KEFLEX) 500 MG capsule Take 1 capsule (500 mg total) by mouth 2 (two) times daily. 01/03/18   Gilda Crease, MD  Ferrous Sulfate (IRON) 325 (65 Fe) MG TABS Take 2 daily Patient taking differently: Take 650 mg by mouth daily.  08/04/17   Adline Potter, NP  Fluticasone-Salmeterol (ADVAIR) 100-50 MCG/DOSE AEPB Inhale 1-2 puffs into the lungs 2 (two) times daily.    [provider]  furosemide (LASIX) 40 MG tablet Take 40 mg by mouth 2 (two) times daily.     [provider]  hydrALAZINE (APRESOLINE) 25 MG tablet Take 25 mg by mouth 2 (two) times daily.    [provider]  labetalol (NORMODYNE) 300 MG tablet Take 300 mg by mouth 2 (two) times daily.    [provider]  megestrol (MEGACE) 40 MG tablet Take three tabs per day for 5 days, then 2 tabs per day for 5 days, then take 1 tab per day 08/30/17   Linwood Dibbles, MD  naproxen (NAPROSYN) 500 MG tablet Take 1 tablet (500 mg total) by mouth 2 (two) times daily. 08/30/17   Linwood Dibbles, MD  nitroGLYCERIN (NITROSTAT) 0.4 MG SL tablet Place 1 tablet (0.4 mg total) under the tongue every 5 (five) minutes x 3 doses as needed for chest pain. 11/13/16   Houston Siren, MD    Family  History Family History  Problem Relation Age of Onset  . Cancer Mother   . Other Mother        has a pacemaker  . Heart attack Father   . Other Father        was on dialysis  . Hypertension Brother   . Hypertension Brother     Social History Social History   Tobacco Use  . Smoking status: Never Smoker  . Smokeless tobacco: Never Used  Substance Use Topics  . Alcohol use: No  . Drug use: No     Allergies   Iodine   Review of Systems Review of Systems  Gastrointestinal: Positive for abdominal pain.  All other systems reviewed and are negative.    Physical Exam Updated Vital Signs BP (!) 218/72   Pulse 71   Temp 97.8 F (36.6 C) (Oral)   Resp 18   Ht 5\' 3"  (1.6 m)   Wt 126.1 kg (278 lb)   SpO2 96%   BMI 49.25 kg/m   Physical Exam  Constitutional: She is oriented to person, place, and time. She appears  well-developed and well-nourished. No distress.  HENT:  Head: Normocephalic and atraumatic.  Right Ear: Hearing normal.  Left Ear: Hearing normal.  Nose: Nose normal.  Mouth/Throat: Oropharynx is clear and moist and mucous membranes are normal.  Eyes: Conjunctivae and EOM are normal. Pupils are equal, round, and reactive to light.  Neck: Normal range of motion. Neck supple.  Cardiovascular: Regular rhythm, S1 normal and S2 normal. Exam reveals no gallop and no friction rub.  No murmur heard. Pulmonary/Chest: Effort normal and breath sounds normal. No respiratory distress. She exhibits no tenderness.  Abdominal: Soft. Normal appearance and bowel sounds are normal. There is no hepatosplenomegaly. There is tenderness in the left lower quadrant. There is no rebound, no guarding, no tenderness at McBurney's point and negative Murphy's sign. No hernia.  Musculoskeletal: Normal range of motion.  Neurological: She is alert and oriented to person, place, and time. She has normal strength. No cranial nerve deficit or sensory deficit. Coordination normal. GCS eye subscore is 4. GCS verbal subscore is 5. GCS motor subscore is 6.  Skin: Skin is warm, dry and intact. No rash noted. No cyanosis.  Psychiatric: She has a normal mood and affect. Her speech is normal and behavior is normal. Thought content normal.  Nursing note and vitals reviewed.    ED Treatments / Results  Labs (all labs ordered are listed, but only abnormal results are displayed) Labs Reviewed  CBC WITH DIFFERENTIAL/PLATELET - Abnormal; Notable for the following components:      Result Value   RDW 15.8 (*)    All other components within normal limits  COMPREHENSIVE METABOLIC PANEL - Abnormal; Notable for the following components:   Glucose, Bld 146 (*)    Creatinine, Ser 1.09 (*)    GFR calc non Af Amer 57 (*)    All other components within normal limits  URINALYSIS, ROUTINE W REFLEX MICROSCOPIC - Abnormal; Notable for the  following components:   APPearance HAZY (*)    Glucose, UA 50 (*)    All other components within normal limits  LIPASE, BLOOD    EKG  EKG Interpretation None       Radiology Ct Renal Stone Study  Result Date: 01/03/2018 CLINICAL DATA:  52 y/o F; left-sided abdominal pain radiating to the flank. Nausea and vomiting. EXAM: CT ABDOMEN AND PELVIS WITHOUT CONTRAST TECHNIQUE: Multidetector CT imaging of the abdomen  and pelvis was performed following the standard protocol without IV contrast. COMPARISON:  01/05/2011 CT abdomen and pelvis. FINDINGS: Lower chest: No acute abnormality. Hepatobiliary: No focal liver abnormality is seen. No gallstones, gallbladder wall thickening, or biliary dilatation. Pancreas: Unremarkable. No pancreatic ductal dilatation or surrounding inflammatory changes. Spleen: Normal in size without focal abnormality. Adrenals/Urinary Tract: Adrenal glands are unremarkable. Kidneys are normal, without renal calculi, focal lesion, or hydronephrosis. Bladder wall thickening. Stomach/Bowel: Stomach is within normal limits. Appendix appears normal. No evidence of bowel wall thickening, distention, or inflammatory changes. Vascular/Lymphatic: No significant vascular findings are present. No enlarged abdominal or pelvic lymph nodes. Reproductive: Uterus and bilateral adnexa are unremarkable. Other: No abdominal wall hernia or abnormality. No abdominopelvic ascites. Musculoskeletal: No acute or significant osseous findings. IMPRESSION: Bladder wall thickening may represent cystitis. No urinary stone disease or hydronephrosis. Otherwise unremarkable CT of abdomen and pelvis. Electronically Signed   By: Mitzi Hansen M.D.   On: 01/03/2018 05:46    Procedures Procedures (including critical care time)  Medications Ordered in ED Medications  benazepril (LOTENSIN) tablet 40 mg (not administered)  hydrALAZINE (APRESOLINE) tablet 25 mg (not administered)  labetalol (NORMODYNE)  tablet 300 mg (not administered)  HYDROmorphone (DILAUDID) injection 1 mg (1 mg Intravenous Given 01/03/18 0506)  ondansetron (ZOFRAN) injection 4 mg (4 mg Intravenous Given 01/03/18 0504)     Initial Impression / Assessment and Plan / ED Course  I have reviewed the triage vital signs and the nursing notes.  Pertinent labs & imaging results that were available during my care of the patient were reviewed by me and considered in my medical decision making (see chart for details).     Patient presents to the ER for evaluation of abdominal pain which awakened her from sleep.  Patient complaining of left lower abdominal pain radiating to the left side and flank area.  She has had nausea and vomiting associated.  Patient's blood work and urinalysis were unremarkable.  CT scan performed, no acute abnormality noted other than thickened bladder wall possible cystitis.  Patient feeling much better after analgesia.  Will empirically cover the cystitis.  Patient's blood pressure has slowly gone up here in the ER.  She has a history of severe and difficult to control hypertension.  She is on multiple agents and is due for all of her medications.  She was given her morning meds to address her blood pressure issue.  Final Clinical Impressions(s) / ED Diagnoses   Final diagnoses:  Cystitis  Essential hypertension    ED Discharge Orders        Ordered    cephALEXin (KEFLEX) 500 MG capsule  2 times daily     01/03/18 0630       Nelwyn Hebdon, Canary Brim, MD 01/03/18 0630

## 2018-01-03 NOTE — ED Triage Notes (Signed)
Pt C/O left sided abdominal pain that woke her up from sleep around 0130 this morning that radiates to the flank. Pt reports N/V.

## 2018-01-04 DIAGNOSIS — K810 Acute cholecystitis: Secondary | ICD-10-CM | POA: Diagnosis not present

## 2018-01-04 DIAGNOSIS — I4891 Unspecified atrial fibrillation: Secondary | ICD-10-CM | POA: Diagnosis not present

## 2018-01-04 DIAGNOSIS — I472 Ventricular tachycardia: Secondary | ICD-10-CM | POA: Diagnosis not present

## 2018-01-04 DIAGNOSIS — Z6841 Body Mass Index (BMI) 40.0 and over, adult: Secondary | ICD-10-CM | POA: Diagnosis not present

## 2018-01-04 DIAGNOSIS — K5900 Constipation, unspecified: Secondary | ICD-10-CM | POA: Diagnosis not present

## 2018-01-04 DIAGNOSIS — K8000 Calculus of gallbladder with acute cholecystitis without obstruction: Secondary | ICD-10-CM | POA: Diagnosis not present

## 2018-01-04 DIAGNOSIS — I5042 Chronic combined systolic (congestive) and diastolic (congestive) heart failure: Secondary | ICD-10-CM | POA: Diagnosis not present

## 2018-01-06 DIAGNOSIS — Z8249 Family history of ischemic heart disease and other diseases of the circulatory system: Secondary | ICD-10-CM | POA: Diagnosis not present

## 2018-01-06 DIAGNOSIS — K5900 Constipation, unspecified: Secondary | ICD-10-CM | POA: Diagnosis present

## 2018-01-06 DIAGNOSIS — Z809 Family history of malignant neoplasm, unspecified: Secondary | ICD-10-CM | POA: Diagnosis not present

## 2018-01-06 DIAGNOSIS — I5042 Chronic combined systolic (congestive) and diastolic (congestive) heart failure: Secondary | ICD-10-CM | POA: Diagnosis present

## 2018-01-06 DIAGNOSIS — I4891 Unspecified atrial fibrillation: Secondary | ICD-10-CM | POA: Diagnosis present

## 2018-01-06 DIAGNOSIS — Z6841 Body Mass Index (BMI) 40.0 and over, adult: Secondary | ICD-10-CM | POA: Diagnosis not present

## 2018-01-06 DIAGNOSIS — Z841 Family history of disorders of kidney and ureter: Secondary | ICD-10-CM | POA: Diagnosis not present

## 2018-01-06 DIAGNOSIS — I110 Hypertensive heart disease with heart failure: Secondary | ICD-10-CM | POA: Diagnosis present

## 2018-01-06 DIAGNOSIS — K8000 Calculus of gallbladder with acute cholecystitis without obstruction: Secondary | ICD-10-CM | POA: Diagnosis present

## 2018-01-06 DIAGNOSIS — E785 Hyperlipidemia, unspecified: Secondary | ICD-10-CM | POA: Diagnosis present

## 2018-01-06 DIAGNOSIS — Z79899 Other long term (current) drug therapy: Secondary | ICD-10-CM | POA: Diagnosis not present

## 2018-01-06 DIAGNOSIS — Z91041 Radiographic dye allergy status: Secondary | ICD-10-CM | POA: Diagnosis not present

## 2018-01-06 DIAGNOSIS — K810 Acute cholecystitis: Secondary | ICD-10-CM | POA: Diagnosis not present

## 2018-01-06 DIAGNOSIS — I472 Ventricular tachycardia: Secondary | ICD-10-CM | POA: Diagnosis present

## 2018-01-06 DIAGNOSIS — Z7982 Long term (current) use of aspirin: Secondary | ICD-10-CM | POA: Diagnosis not present

## 2018-05-02 DIAGNOSIS — I482 Chronic atrial fibrillation: Secondary | ICD-10-CM | POA: Diagnosis not present

## 2018-05-02 DIAGNOSIS — Z0000 Encounter for general adult medical examination without abnormal findings: Secondary | ICD-10-CM | POA: Diagnosis not present

## 2018-05-02 DIAGNOSIS — I5032 Chronic diastolic (congestive) heart failure: Secondary | ICD-10-CM | POA: Diagnosis not present

## 2018-05-02 DIAGNOSIS — I10 Essential (primary) hypertension: Secondary | ICD-10-CM | POA: Diagnosis not present

## 2018-05-17 DIAGNOSIS — I5032 Chronic diastolic (congestive) heart failure: Secondary | ICD-10-CM | POA: Diagnosis not present

## 2018-05-17 DIAGNOSIS — I482 Chronic atrial fibrillation: Secondary | ICD-10-CM | POA: Diagnosis not present

## 2018-05-17 DIAGNOSIS — R7303 Prediabetes: Secondary | ICD-10-CM | POA: Diagnosis not present

## 2018-05-17 DIAGNOSIS — I10 Essential (primary) hypertension: Secondary | ICD-10-CM | POA: Diagnosis not present

## 2018-05-17 DIAGNOSIS — Z0000 Encounter for general adult medical examination without abnormal findings: Secondary | ICD-10-CM | POA: Diagnosis not present

## 2018-06-08 DIAGNOSIS — Z1231 Encounter for screening mammogram for malignant neoplasm of breast: Secondary | ICD-10-CM | POA: Diagnosis not present

## 2018-06-11 DIAGNOSIS — M170 Bilateral primary osteoarthritis of knee: Secondary | ICD-10-CM | POA: Diagnosis not present

## 2018-06-12 DIAGNOSIS — M170 Bilateral primary osteoarthritis of knee: Secondary | ICD-10-CM | POA: Diagnosis not present

## 2018-07-23 DIAGNOSIS — Z1211 Encounter for screening for malignant neoplasm of colon: Secondary | ICD-10-CM | POA: Diagnosis not present

## 2018-08-07 DIAGNOSIS — I10 Essential (primary) hypertension: Secondary | ICD-10-CM | POA: Diagnosis not present

## 2018-08-07 DIAGNOSIS — I5032 Chronic diastolic (congestive) heart failure: Secondary | ICD-10-CM | POA: Diagnosis not present

## 2018-08-07 DIAGNOSIS — M170 Bilateral primary osteoarthritis of knee: Secondary | ICD-10-CM | POA: Diagnosis not present

## 2018-11-08 DIAGNOSIS — I10 Essential (primary) hypertension: Secondary | ICD-10-CM | POA: Diagnosis not present

## 2018-11-08 DIAGNOSIS — Z7689 Persons encountering health services in other specified circumstances: Secondary | ICD-10-CM | POA: Diagnosis not present

## 2018-11-08 DIAGNOSIS — M170 Bilateral primary osteoarthritis of knee: Secondary | ICD-10-CM | POA: Diagnosis not present

## 2018-11-08 DIAGNOSIS — Z0000 Encounter for general adult medical examination without abnormal findings: Secondary | ICD-10-CM | POA: Diagnosis not present

## 2018-11-08 DIAGNOSIS — I5032 Chronic diastolic (congestive) heart failure: Secondary | ICD-10-CM | POA: Diagnosis not present

## 2018-11-11 ENCOUNTER — Encounter (HOSPITAL_COMMUNITY): Payer: Self-pay | Admitting: *Deleted

## 2018-11-11 ENCOUNTER — Emergency Department (HOSPITAL_COMMUNITY)
Admission: EM | Admit: 2018-11-11 | Discharge: 2018-11-11 | Disposition: A | Discharge status: Home / Self Care | Payer: Medicare Other | Attending: Emergency Medicine | Admitting: Emergency Medicine

## 2018-11-11 ENCOUNTER — Other Ambulatory Visit: Payer: Self-pay

## 2018-11-11 DIAGNOSIS — J45909 Unspecified asthma, uncomplicated: Secondary | ICD-10-CM | POA: Insufficient documentation

## 2018-11-11 DIAGNOSIS — I10 Essential (primary) hypertension: Secondary | ICD-10-CM | POA: Diagnosis not present

## 2018-11-11 DIAGNOSIS — Z79899 Other long term (current) drug therapy: Secondary | ICD-10-CM | POA: Insufficient documentation

## 2018-11-11 DIAGNOSIS — I5022 Chronic systolic (congestive) heart failure: Secondary | ICD-10-CM | POA: Diagnosis not present

## 2018-11-11 DIAGNOSIS — I110 Hypertensive heart disease with heart failure: Secondary | ICD-10-CM | POA: Insufficient documentation

## 2018-11-11 DIAGNOSIS — Z7982 Long term (current) use of aspirin: Secondary | ICD-10-CM | POA: Diagnosis not present

## 2018-11-11 MED ORDER — LABETALOL HCL 300 MG PO TABS: 300.0000 mg | ORAL_TABLET | Freq: Two times a day (BID) | ORAL | 0 refills | Status: AC

## 2018-11-11 MED ORDER — LABETALOL HCL 200 MG PO TABS
300.0000 mg | ORAL_TABLET | Freq: Once | ORAL | Status: AC
  Administered 2018-11-11: 300 mg via ORAL
  Filled 2018-11-11: qty 2

## 2018-11-11 NOTE — ED Triage Notes (Signed)
Pt states that she has been checking her blood pressure this weekend and it has been elevated all weekend, tonight reading was 220/108 which caused her to come to er, pt denies any symptoms,

## 2018-11-11 NOTE — ED Provider Notes (Signed)
[facility] EMERGENCY DEPARTMENT Provider Note   CSN: [id number] Arrival date & time: 11/11/18  0146  Time seen 03:25 AM   History   Chief Complaint Chief Complaint  Patient presents with  . Hypertension    HPI [name] is a 52 y.o. female.  HPI patient states she has a history of hypertension and congestive heart failure.  She states she was taking benazepril, hydralazine, and labetalol for her blood pressure.  However 3 to 4 months ago her doctor stopped prescribing it for her and she has been off of it.  She had her first doctor's appointment since that time on November 21.  She states they never discussed that she was going to be taken off of the labetalol.  Her blood pressure was noted to be high in the office at 190/82.  She states it is normally in the 1 40-1 49 systolic range and 80-90 diastolic range.  They told her to start monitoring her blood pressure daily.  She states also on that same day she stopped drinking sodas.  She has a mild frontal aching headache that she has had before when she stopped drinking caffeine.  Otherwise she denies any visual changes, nausea, vomiting, chest pain, shortness of breath, numbness of her extremities.  She states she had a persistently elevated blood pressure today and she came to the ED.  PCP Toma Deiters, MD   Past Medical History:  Diagnosis Date  . Anemia   . Asthma 12/20/2011  . Blood transfusion without reported diagnosis   . Bronchitis 12/19/2009  . CHF (congestive heart failure) (HCC)   . Chronic neck pain   . Hypertension   . Obesity   . Paresthesias     Patient Active Problem List   Diagnosis Date Noted  . Iron deficiency anemia due to chronic blood loss 08/04/2017  . Perimenopausal 08/04/2017  . Abnormal uterine bleeding (AUB) 08/04/2017  . Grief reaction 08/04/2017  . Chest pain 11/12/2016  . Chronic systolic CHF (congestive heart failure) (HCC) 11/12/2016  . Hypokalemia 11/12/2016  . Hypertensive  urgency 11/12/2016    Past Surgical History:  Procedure Laterality Date  . CESAREAN SECTION       OB History    Gravida  3   Para  3   Term  3   Preterm      AB      Living  3     SAB      TAB      Ectopic      Multiple      Live Births  3            Home Medications    Prior to Admission medications   Medication Sig Start Date End Date Taking? Authorizing Provider  aspirin 81 MG chewable tablet Chew 81 mg by mouth daily.    Yes [provider]  benazepril (LOTENSIN) 40 MG tablet Take 40 mg by mouth daily.    Yes [provider]  Fluticasone-Salmeterol (ADVAIR) 100-50 MCG/DOSE AEPB Inhale 1-2 puffs into the lungs 2 (two) times daily.   Yes [provider]  furosemide (LASIX) 40 MG tablet Take 40 mg by mouth 2 (two) times daily.    Yes [provider]  cephALEXin (KEFLEX) 500 MG capsule Take 1 capsule (500 mg total) by mouth 2 (two) times daily. 01/03/18   Gilda Crease, MD  Ferrous Sulfate (IRON) 325 (65 Fe) MG TABS Take 2 daily Patient taking differently:  Take 650 mg by mouth daily.  08/04/17   Adline Potter, NP  hydrALAZINE (APRESOLINE) 25 MG tablet Take 25 mg by mouth 2 (two) times daily.    [provider]  labetalol (NORMODYNE) 300 MG tablet Take 1 tablet (300 mg total) by mouth 2 (two) times daily. 11/11/18   Devoria Albe, MD  megestrol (MEGACE) 40 MG tablet Take three tabs per day for 5 days, then 2 tabs per day for 5 days, then take 1 tab per day 08/30/17   Linwood Dibbles, MD  naproxen (NAPROSYN) 500 MG tablet Take 1 tablet (500 mg total) by mouth 2 (two) times daily. 08/30/17   Linwood Dibbles, MD  nitroGLYCERIN (NITROSTAT) 0.4 MG SL tablet Place 1 tablet (0.4 mg total) under the tongue every 5 (five) minutes x 3 doses as needed for chest pain. 11/13/16   Houston Siren, MD  Potassium Gluconate 595 MG CAPS Take 1 each by mouth daily.    03/08/12  [provider]    Family History Family History    Problem Relation Age of Onset  . Cancer Mother   . Other Mother        has a pacemaker  . Heart attack Father   . Other Father        was on dialysis  . Hypertension Brother   . Hypertension Brother     Social History Social History   Tobacco Use  . Smoking status: Never Smoker  . Smokeless tobacco: Never Used  Substance Use Topics  . Alcohol use: No  . Drug use: No  on disability for CHF and HTN   Allergies   Iodine   Review of Systems Review of Systems  All other systems reviewed and are negative.    Physical Exam Updated Vital Signs BP (!) 172/81   Pulse (!) 57   Temp 97.9 F (36.6 C) (Oral)   Resp 16   SpO2 92%   Physical Exam  Constitutional: She is oriented to person, place, and time. She appears well-developed and well-nourished.  Non-toxic appearance. She does not appear ill. No distress.  Pleasant in no distress  HENT:  Head: Normocephalic and atraumatic.  Right Ear: External ear normal.  Left Ear: External ear normal.  Nose: Nose normal. No mucosal edema or rhinorrhea.  Mouth/Throat: Oropharynx is clear and moist and mucous membranes are normal. No dental abscesses or uvula swelling.  Eyes: Pupils are equal, round, and reactive to light. Conjunctivae and EOM are normal.  Neck: Normal range of motion and full passive range of motion without pain. Neck supple.  Cardiovascular: Normal rate, regular rhythm and normal heart sounds. Exam reveals no gallop and no friction rub.  No murmur heard. Pulmonary/Chest: Effort normal and breath sounds normal. No respiratory distress. She has no wheezes. She has no rhonchi. She has no rales. She exhibits no tenderness and no crepitus.  Abdominal: Soft. Normal appearance and bowel sounds are normal. She exhibits no distension. There is no tenderness. There is no rebound and no guarding.  Musculoskeletal: Normal range of motion. She exhibits no edema or tenderness.  Moves all extremities well.   Neurological: She  is alert and oriented to person, place, and time. She has normal strength. No cranial nerve deficit.  Skin: Skin is warm, dry and intact. No rash noted. No erythema. No pallor.  Psychiatric: She has a normal mood and affect. Her speech is normal and behavior is normal. Her mood appears not anxious.  Nursing note and  vitals reviewed.    ED Treatments / Results  Labs (all labs ordered are listed, but only abnormal results are displayed) Labs Reviewed - No data to display  EKG None  Radiology No results found.  Procedures Procedures (including critical care time)  Medications Ordered in ED Medications  labetalol (NORMODYNE) tablet 300 mg (300 mg Oral Given 11/11/18 0340)     Initial Impression / Assessment and Plan / ED Course  I have reviewed the triage vital signs and the nursing notes.  Pertinent labs & imaging results that were available during my care of the patient were reviewed by me and considered in my medical decision making (see chart for details).     Patient's initial blood pressure was 210/112.  It did improved to 180/94 and then started to drift up again.  She was started back on her usual dose of labetalol 300 mg orally.  It seems it may have been an oversight that her doctor stopped prescribing it.  She states they never discussed they were going to stop that medicine.  Recheck at 6 AM patient's blood pressure is 166/86.  She feels fine.  She was discharged home with by another prescription for her labetalol.  We discussed talking to her doctor's office on Monday to see if he wants her to continue it which I think he will.  She was advised it might take a couple days for it to stabilize her blood pressure.  Final Clinical Impressions(s) / ED Diagnoses   Final diagnoses:  Essential hypertension    ED Discharge Orders         Ordered    labetalol (NORMODYNE) 300 MG tablet  2 times daily     11/11/18 1610          Plan discharge  Devoria Albe, MD,  Concha Pyo, MD 11/11/18 [phone]

## 2018-11-11 NOTE — Discharge Instructions (Addendum)
Restart your labetalol.  Continue to monitor your blood pressure.  You should see that your blood pressure slowly starts to stabilize.  Please call your doctor's office on Monday, November 25, to let him know about your ED visit.  I suspect he will want you to continue on the labetalol.  If he does not he will suggest a different medication.  Return to the emergency department if you get a severe headache, chest pain, shortness of breath, or get new numbness or tingling of your extremities.

## 2018-12-25 DIAGNOSIS — M810 Age-related osteoporosis without current pathological fracture: Secondary | ICD-10-CM | POA: Diagnosis not present

## 2018-12-25 DIAGNOSIS — Z780 Asymptomatic menopausal state: Secondary | ICD-10-CM | POA: Diagnosis not present

## 2019-02-13 DIAGNOSIS — I5032 Chronic diastolic (congestive) heart failure: Secondary | ICD-10-CM | POA: Diagnosis not present

## 2019-02-13 DIAGNOSIS — M170 Bilateral primary osteoarthritis of knee: Secondary | ICD-10-CM | POA: Diagnosis not present

## 2019-02-13 DIAGNOSIS — Z1389 Encounter for screening for other disorder: Secondary | ICD-10-CM | POA: Diagnosis not present

## 2019-02-13 DIAGNOSIS — Z0000 Encounter for general adult medical examination without abnormal findings: Secondary | ICD-10-CM | POA: Diagnosis not present

## 2019-02-13 DIAGNOSIS — I10 Essential (primary) hypertension: Secondary | ICD-10-CM | POA: Diagnosis not present

## 2019-05-17 DIAGNOSIS — M170 Bilateral primary osteoarthritis of knee: Secondary | ICD-10-CM | POA: Diagnosis not present

## 2019-05-17 DIAGNOSIS — R7303 Prediabetes: Secondary | ICD-10-CM | POA: Diagnosis not present

## 2019-05-17 DIAGNOSIS — I5032 Chronic diastolic (congestive) heart failure: Secondary | ICD-10-CM | POA: Diagnosis not present

## 2019-05-17 DIAGNOSIS — Z0000 Encounter for general adult medical examination without abnormal findings: Secondary | ICD-10-CM | POA: Diagnosis not present

## 2019-05-17 DIAGNOSIS — I10 Essential (primary) hypertension: Secondary | ICD-10-CM | POA: Diagnosis not present

## 2019-07-15 DIAGNOSIS — Z1211 Encounter for screening for malignant neoplasm of colon: Secondary | ICD-10-CM | POA: Diagnosis not present

## 2019-08-21 DIAGNOSIS — I5032 Chronic diastolic (congestive) heart failure: Secondary | ICD-10-CM | POA: Diagnosis not present

## 2019-08-21 DIAGNOSIS — I10 Essential (primary) hypertension: Secondary | ICD-10-CM | POA: Diagnosis not present

## 2019-08-21 DIAGNOSIS — M170 Bilateral primary osteoarthritis of knee: Secondary | ICD-10-CM | POA: Diagnosis not present

## 2019-09-02 DIAGNOSIS — L03031 Cellulitis of right toe: Secondary | ICD-10-CM | POA: Diagnosis not present

## 2019-12-02 DIAGNOSIS — E7849 Other hyperlipidemia: Secondary | ICD-10-CM | POA: Diagnosis not present

## 2019-12-02 DIAGNOSIS — I10 Essential (primary) hypertension: Secondary | ICD-10-CM | POA: Diagnosis not present

## 2019-12-02 DIAGNOSIS — I5032 Chronic diastolic (congestive) heart failure: Secondary | ICD-10-CM | POA: Diagnosis not present

## 2019-12-02 DIAGNOSIS — E119 Type 2 diabetes mellitus without complications: Secondary | ICD-10-CM | POA: Diagnosis not present

## 2019-12-02 DIAGNOSIS — E782 Mixed hyperlipidemia: Secondary | ICD-10-CM | POA: Diagnosis not present

## 2019-12-02 DIAGNOSIS — J45909 Unspecified asthma, uncomplicated: Secondary | ICD-10-CM | POA: Diagnosis not present

## 2020-02-17 DIAGNOSIS — I5020 Unspecified systolic (congestive) heart failure: Secondary | ICD-10-CM | POA: Diagnosis not present

## 2020-02-21 DIAGNOSIS — Z20822 Contact with and (suspected) exposure to covid-19: Secondary | ICD-10-CM | POA: Diagnosis not present

## 2020-02-21 DIAGNOSIS — I472 Ventricular tachycardia: Secondary | ICD-10-CM | POA: Diagnosis not present

## 2020-02-21 DIAGNOSIS — I480 Paroxysmal atrial fibrillation: Secondary | ICD-10-CM | POA: Diagnosis not present

## 2020-02-21 DIAGNOSIS — Z8249 Family history of ischemic heart disease and other diseases of the circulatory system: Secondary | ICD-10-CM | POA: Diagnosis not present

## 2020-02-21 DIAGNOSIS — E785 Hyperlipidemia, unspecified: Secondary | ICD-10-CM | POA: Diagnosis not present

## 2020-02-21 DIAGNOSIS — I509 Heart failure, unspecified: Secondary | ICD-10-CM | POA: Diagnosis not present

## 2020-02-21 DIAGNOSIS — I4891 Unspecified atrial fibrillation: Secondary | ICD-10-CM | POA: Diagnosis not present

## 2020-02-21 DIAGNOSIS — G4733 Obstructive sleep apnea (adult) (pediatric): Secondary | ICD-10-CM | POA: Diagnosis not present

## 2020-02-21 DIAGNOSIS — R000 Tachycardia, unspecified: Secondary | ICD-10-CM | POA: Diagnosis not present

## 2020-02-21 DIAGNOSIS — Z841 Family history of disorders of kidney and ureter: Secondary | ICD-10-CM | POA: Diagnosis not present

## 2020-02-21 DIAGNOSIS — Z7982 Long term (current) use of aspirin: Secondary | ICD-10-CM | POA: Diagnosis not present

## 2020-02-21 DIAGNOSIS — I428 Other cardiomyopathies: Secondary | ICD-10-CM | POA: Diagnosis not present

## 2020-02-21 DIAGNOSIS — R0602 Shortness of breath: Secondary | ICD-10-CM | POA: Diagnosis not present

## 2020-02-21 DIAGNOSIS — I5033 Acute on chronic diastolic (congestive) heart failure: Secondary | ICD-10-CM | POA: Diagnosis not present

## 2020-02-21 DIAGNOSIS — I110 Hypertensive heart disease with heart failure: Secondary | ICD-10-CM | POA: Diagnosis not present

## 2020-02-24 DIAGNOSIS — I509 Heart failure, unspecified: Secondary | ICD-10-CM | POA: Diagnosis not present

## 2020-02-24 DIAGNOSIS — I4891 Unspecified atrial fibrillation: Secondary | ICD-10-CM | POA: Diagnosis not present

## 2020-03-02 DIAGNOSIS — I5020 Unspecified systolic (congestive) heart failure: Secondary | ICD-10-CM | POA: Diagnosis not present

## 2020-03-02 DIAGNOSIS — I482 Chronic atrial fibrillation, unspecified: Secondary | ICD-10-CM | POA: Diagnosis not present

## 2020-03-10 DIAGNOSIS — I10 Essential (primary) hypertension: Secondary | ICD-10-CM | POA: Diagnosis not present

## 2020-03-10 DIAGNOSIS — Z0000 Encounter for general adult medical examination without abnormal findings: Secondary | ICD-10-CM | POA: Diagnosis not present

## 2020-03-10 DIAGNOSIS — I5032 Chronic diastolic (congestive) heart failure: Secondary | ICD-10-CM | POA: Diagnosis not present

## 2020-03-10 DIAGNOSIS — I482 Chronic atrial fibrillation, unspecified: Secondary | ICD-10-CM | POA: Diagnosis not present

## 2020-03-10 DIAGNOSIS — I5020 Unspecified systolic (congestive) heart failure: Secondary | ICD-10-CM | POA: Diagnosis not present

## 2020-03-10 DIAGNOSIS — I480 Paroxysmal atrial fibrillation: Secondary | ICD-10-CM | POA: Diagnosis not present

## 2020-04-20 DIAGNOSIS — R29818 Other symptoms and signs involving the nervous system: Secondary | ICD-10-CM | POA: Diagnosis not present

## 2020-04-20 DIAGNOSIS — I10 Essential (primary) hypertension: Secondary | ICD-10-CM | POA: Diagnosis not present

## 2020-04-20 DIAGNOSIS — I480 Paroxysmal atrial fibrillation: Secondary | ICD-10-CM | POA: Diagnosis not present

## 2020-04-20 DIAGNOSIS — I5022 Chronic systolic (congestive) heart failure: Secondary | ICD-10-CM | POA: Diagnosis not present

## 2020-05-26 DIAGNOSIS — I480 Paroxysmal atrial fibrillation: Secondary | ICD-10-CM | POA: Diagnosis not present

## 2020-05-26 DIAGNOSIS — R0683 Snoring: Secondary | ICD-10-CM | POA: Diagnosis not present

## 2020-06-11 DIAGNOSIS — I480 Paroxysmal atrial fibrillation: Secondary | ICD-10-CM | POA: Diagnosis not present

## 2020-06-11 DIAGNOSIS — I482 Chronic atrial fibrillation, unspecified: Secondary | ICD-10-CM | POA: Diagnosis not present

## 2020-06-11 DIAGNOSIS — Z0000 Encounter for general adult medical examination without abnormal findings: Secondary | ICD-10-CM | POA: Diagnosis not present

## 2020-06-11 DIAGNOSIS — I5020 Unspecified systolic (congestive) heart failure: Secondary | ICD-10-CM | POA: Diagnosis not present

## 2020-06-11 DIAGNOSIS — L739 Follicular disorder, unspecified: Secondary | ICD-10-CM | POA: Diagnosis not present

## 2020-06-11 DIAGNOSIS — I10 Essential (primary) hypertension: Secondary | ICD-10-CM | POA: Diagnosis not present

## 2020-06-11 DIAGNOSIS — I5032 Chronic diastolic (congestive) heart failure: Secondary | ICD-10-CM | POA: Diagnosis not present

## 2020-06-26 DIAGNOSIS — G4710 Hypersomnia, unspecified: Secondary | ICD-10-CM | POA: Diagnosis not present

## 2020-06-26 DIAGNOSIS — I10 Essential (primary) hypertension: Secondary | ICD-10-CM | POA: Diagnosis not present

## 2020-06-26 DIAGNOSIS — G4733 Obstructive sleep apnea (adult) (pediatric): Secondary | ICD-10-CM | POA: Diagnosis not present

## 2020-06-26 DIAGNOSIS — I509 Heart failure, unspecified: Secondary | ICD-10-CM | POA: Diagnosis not present

## 2020-07-08 DIAGNOSIS — I4891 Unspecified atrial fibrillation: Secondary | ICD-10-CM | POA: Diagnosis not present

## 2020-07-08 DIAGNOSIS — Z20822 Contact with and (suspected) exposure to covid-19: Secondary | ICD-10-CM | POA: Diagnosis not present

## 2020-07-08 DIAGNOSIS — Z01812 Encounter for preprocedural laboratory examination: Secondary | ICD-10-CM | POA: Diagnosis not present

## 2020-07-08 DIAGNOSIS — I480 Paroxysmal atrial fibrillation: Secondary | ICD-10-CM | POA: Diagnosis not present

## 2020-07-09 ENCOUNTER — Encounter (HOSPITAL_BASED_OUTPATIENT_CLINIC_OR_DEPARTMENT_OTHER): Payer: Self-pay

## 2020-07-09 DIAGNOSIS — G4733 Obstructive sleep apnea (adult) (pediatric): Secondary | ICD-10-CM

## 2020-07-09 NOTE — Progress Notes (Unsigned)
spl 

## 2020-07-10 DIAGNOSIS — I4819 Other persistent atrial fibrillation: Secondary | ICD-10-CM | POA: Diagnosis not present

## 2020-07-10 DIAGNOSIS — G4733 Obstructive sleep apnea (adult) (pediatric): Secondary | ICD-10-CM | POA: Diagnosis not present

## 2020-07-10 DIAGNOSIS — I4891 Unspecified atrial fibrillation: Secondary | ICD-10-CM | POA: Diagnosis not present

## 2020-07-10 DIAGNOSIS — I428 Other cardiomyopathies: Secondary | ICD-10-CM | POA: Diagnosis not present

## 2020-07-10 DIAGNOSIS — Z7901 Long term (current) use of anticoagulants: Secondary | ICD-10-CM | POA: Diagnosis not present

## 2020-07-10 DIAGNOSIS — J45909 Unspecified asthma, uncomplicated: Secondary | ICD-10-CM | POA: Diagnosis not present

## 2020-07-10 DIAGNOSIS — I480 Paroxysmal atrial fibrillation: Secondary | ICD-10-CM | POA: Diagnosis not present

## 2020-07-10 DIAGNOSIS — I110 Hypertensive heart disease with heart failure: Secondary | ICD-10-CM | POA: Diagnosis not present

## 2020-07-10 DIAGNOSIS — Z888 Allergy status to other drugs, medicaments and biological substances status: Secondary | ICD-10-CM | POA: Diagnosis not present

## 2020-07-10 DIAGNOSIS — Z9049 Acquired absence of other specified parts of digestive tract: Secondary | ICD-10-CM | POA: Diagnosis not present

## 2020-07-10 DIAGNOSIS — I5042 Chronic combined systolic (congestive) and diastolic (congestive) heart failure: Secondary | ICD-10-CM | POA: Diagnosis not present

## 2020-07-10 DIAGNOSIS — Z79899 Other long term (current) drug therapy: Secondary | ICD-10-CM | POA: Diagnosis not present

## 2020-07-20 DIAGNOSIS — I480 Paroxysmal atrial fibrillation: Secondary | ICD-10-CM | POA: Diagnosis not present

## 2020-07-20 DIAGNOSIS — R29818 Other symptoms and signs involving the nervous system: Secondary | ICD-10-CM | POA: Diagnosis not present

## 2020-07-20 DIAGNOSIS — I5022 Chronic systolic (congestive) heart failure: Secondary | ICD-10-CM | POA: Diagnosis not present

## 2020-07-20 DIAGNOSIS — I10 Essential (primary) hypertension: Secondary | ICD-10-CM | POA: Diagnosis not present

## 2020-08-05 ENCOUNTER — Other Ambulatory Visit: Payer: Self-pay

## 2020-08-05 ENCOUNTER — Ambulatory Visit: Payer: Medicare Other | Attending: Neurology | Admitting: Neurology

## 2020-08-05 DIAGNOSIS — G4733 Obstructive sleep apnea (adult) (pediatric): Secondary | ICD-10-CM | POA: Diagnosis not present

## 2020-08-05 DIAGNOSIS — J4540 Moderate persistent asthma, uncomplicated: Secondary | ICD-10-CM | POA: Diagnosis not present

## 2020-08-12 NOTE — Procedures (Signed)
[place] A. Merlene Laughter, MD     [email or web address]             NOCTURNAL POLYSOMNOGRAPHY   LOCATION: [facility]  Patient Name: [name], [name] Date: 08/05/2020 Gender: Female D.O.B: 02/18/1966 Age (years): 48 Referring Provider: Barton Fanny NP Height (inches): 63 Interpreting Physician: Phillips Odor MD, ABSM Weight (lbs): 324 RPSGT: Peak, Robert BMI: 57 MRN: [id number] Neck Size: 18.00 CLINICAL INFORMATION The patient is referred for a split night study with BPAP.   MEDICATIONS Medications self-administered by patient taken the night of the study : N/A  Current Outpatient Medications:  .  aspirin 81 MG chewable tablet, Chew 81 mg by mouth daily. , Disp: , Rfl:  .  benazepril (LOTENSIN) 40 MG tablet, Take 40 mg by mouth daily. , Disp: , Rfl:  .  cephALEXin (KEFLEX) 500 MG capsule, Take 1 capsule (500 mg total) by mouth 2 (two) times daily., Disp: 14 capsule, Rfl: 0 .  Ferrous Sulfate (IRON) 325 (65 Fe) MG TABS, Take 2 daily (Patient taking differently: Take 650 mg by mouth daily. ), Disp: 30 each, Rfl: 0 .  Fluticasone-Salmeterol (ADVAIR) 100-50 MCG/DOSE AEPB, Inhale 1-2 puffs into the lungs 2 (two) times daily., Disp: , Rfl:  .  furosemide (LASIX) 40 MG tablet, Take 40 mg by mouth 2 (two) times daily. , Disp: , Rfl:  .  hydrALAZINE (APRESOLINE) 25 MG tablet, Take 25 mg by mouth 2 (two) times daily., Disp: , Rfl:  .  labetalol (NORMODYNE) 300 MG tablet, Take 1 tablet (300 mg total) by mouth 2 (two) times daily., Disp: 60 tablet, Rfl: 0 .  megestrol (MEGACE) 40 MG tablet, Take three tabs per day for 5 days, then 2 tabs per day for 5 days, then take 1 tab per day, Disp: 45 tablet, Rfl: 0 .  naproxen (NAPROSYN) 500 MG tablet, Take 1 tablet (500 mg total) by mouth 2 (two) times daily., Disp: 30 tablet, Rfl: 0 .  nitroGLYCERIN (NITROSTAT) 0.4 MG SL tablet, Place 1 tablet (0.4 mg total) under the tongue every 5 (five) minutes x 3 doses as needed for  chest pain., Disp: 100 tablet, Rfl: 1   SLEEP STUDY TECHNIQUE As per the AASM Manual for the Scoring of Sleep and Associated Events v2.3 (April 2016) with a hypopnea requiring 4% desaturations.  The channels recorded and monitored were frontal, central and occipital EEG, electrooculogram (EOG), submentalis EMG (chin), nasal and oral airflow, thoracic and abdominal wall motion, anterior tibialis EMG, snore microphone, electrocardiogram, and pulse oximetry. Bi-level positive airway pressure (BiPAP) was initiated when the patient met split night criteria and was titrated according to treat sleep-disordered breathing.  RESPIRATORY PARAMETERS Diagnostic  Total AHI (/hr): 66.1 RDI (/hr): 69.8 OA Index (/hr): 6.6 CA Index (/hr): 0.5 REM AHI (/hr): N/A NREM AHI (/hr): 66.1 Supine AHI (/hr): N/A Non-supine AHI (/hr): 69.84 Min O2 Sat (%): 74.0 Mean O2 (%): 87.9 Time below 88% (min): 79   Titration  Optimal IPAP Pressure (cm):  Optimal EPAP Pressure (cm):  AHI at Optimal Pressure (/hr): N/A Min O2 at Optimal Pressure (%): 76.0 Sleep % at Optimal (%): N/A Supine % at Optimal (%): N/A   The patient was titrated on CPAP started in 4 and up to 12. She had difficulties breathing out against the pressure was switched bilevel pressures., she only was on this for 8 minutes at levels  14/10. Patient persistent saturations even without apneic events suggesting hypoventilation syndrome. She initially did well with nasal  CPAP but had some difficulties opening her mouth during the latter part of the night.     SLEEP ARCHITECTURE The study was initiated at 10:06:39 PM and terminated at 5:33:20 AM. The total recorded time was 446.7 minutes. EEG confirmed total sleep time was 369.9 minutes yielding a sleep efficiency of 82.8%%. Sleep onset after lights out was 4.8 minutes with a REM latency of 294.5 minutes. The patient spent 8.9%% of the night in stage N1 sleep, 83.9%% in stage N2 sleep, 0.0%% in stage N3 and 7.2% in  REM. Wake after sleep onset (WASO) was 72.0 minutes. The Arousal Index was 14.9/hour.  LEG MOVEMENT DATA The total Periodic Limb Movements of Sleep (PLMS) were 0. The PLMS index was 0.0 .  CARDIAC DATA The 2 lead EKG demonstrated sinus rhythm. The mean heart rate was 100.0 beats per minute. Other EKG findings include: PVCs.   IMPRESSIONS 1. Severe obstructive sleep apnea is documented with this study (AHI = 66.1 /hour).  She had difficulties breathing out against CPAP and was switched to bilevel pressures but enough time was not available for successful titration. Auto Bipap 10/6 - 20/16 is therefore recommended. 2. Hypoventilation syndrome is noted. The patient may require supplemental oxygen.  Nocturnal oximetry is recommended 1 month after she has been on positive pressure treatment to determine if additional nocturnal supplemental oxygen is required.   Delano Metz, MD Diplomate, American Board of Sleep Medicine. ELECTRONICALLY SIGNED ON:  08/12/2020, 8:30 AM [place] PH: [phone]   FX: [phone] [place]

## 2020-08-21 DIAGNOSIS — I509 Heart failure, unspecified: Secondary | ICD-10-CM | POA: Diagnosis not present

## 2020-08-21 DIAGNOSIS — I10 Essential (primary) hypertension: Secondary | ICD-10-CM | POA: Diagnosis not present

## 2020-08-21 DIAGNOSIS — G4733 Obstructive sleep apnea (adult) (pediatric): Secondary | ICD-10-CM | POA: Diagnosis not present

## 2020-08-21 DIAGNOSIS — R0902 Hypoxemia: Secondary | ICD-10-CM | POA: Diagnosis not present

## 2020-08-21 DIAGNOSIS — G4710 Hypersomnia, unspecified: Secondary | ICD-10-CM | POA: Diagnosis not present

## 2020-08-25 DIAGNOSIS — Z7901 Long term (current) use of anticoagulants: Secondary | ICD-10-CM | POA: Diagnosis not present

## 2020-08-25 DIAGNOSIS — Z7951 Long term (current) use of inhaled steroids: Secondary | ICD-10-CM | POA: Diagnosis not present

## 2020-08-25 DIAGNOSIS — Z79899 Other long term (current) drug therapy: Secondary | ICD-10-CM | POA: Diagnosis not present

## 2020-08-25 DIAGNOSIS — I110 Hypertensive heart disease with heart failure: Secondary | ICD-10-CM | POA: Diagnosis not present

## 2020-08-25 DIAGNOSIS — J45909 Unspecified asthma, uncomplicated: Secondary | ICD-10-CM | POA: Diagnosis not present

## 2020-08-25 DIAGNOSIS — G4733 Obstructive sleep apnea (adult) (pediatric): Secondary | ICD-10-CM | POA: Diagnosis not present

## 2020-08-25 DIAGNOSIS — R9431 Abnormal electrocardiogram [ECG] [EKG]: Secondary | ICD-10-CM | POA: Diagnosis not present

## 2020-08-25 DIAGNOSIS — Z9049 Acquired absence of other specified parts of digestive tract: Secondary | ICD-10-CM | POA: Diagnosis not present

## 2020-08-25 DIAGNOSIS — I5023 Acute on chronic systolic (congestive) heart failure: Secondary | ICD-10-CM | POA: Diagnosis not present

## 2020-08-25 DIAGNOSIS — I480 Paroxysmal atrial fibrillation: Secondary | ICD-10-CM | POA: Diagnosis not present

## 2020-08-25 DIAGNOSIS — I5022 Chronic systolic (congestive) heart failure: Secondary | ICD-10-CM | POA: Diagnosis not present

## 2020-09-10 DIAGNOSIS — G4733 Obstructive sleep apnea (adult) (pediatric): Secondary | ICD-10-CM | POA: Diagnosis not present

## 2020-09-23 DIAGNOSIS — I5032 Chronic diastolic (congestive) heart failure: Secondary | ICD-10-CM | POA: Diagnosis not present

## 2020-09-23 DIAGNOSIS — J4540 Moderate persistent asthma, uncomplicated: Secondary | ICD-10-CM | POA: Diagnosis not present

## 2020-09-23 DIAGNOSIS — I10 Essential (primary) hypertension: Secondary | ICD-10-CM | POA: Diagnosis not present

## 2020-10-10 DIAGNOSIS — G4733 Obstructive sleep apnea (adult) (pediatric): Secondary | ICD-10-CM | POA: Diagnosis not present

## 2020-10-14 DIAGNOSIS — Z20822 Contact with and (suspected) exposure to covid-19: Secondary | ICD-10-CM | POA: Diagnosis not present

## 2020-10-27 DIAGNOSIS — I480 Paroxysmal atrial fibrillation: Secondary | ICD-10-CM | POA: Diagnosis not present

## 2020-11-10 DIAGNOSIS — G4733 Obstructive sleep apnea (adult) (pediatric): Secondary | ICD-10-CM | POA: Diagnosis not present

## 2020-11-24 DIAGNOSIS — I10 Essential (primary) hypertension: Secondary | ICD-10-CM | POA: Diagnosis not present

## 2020-11-24 DIAGNOSIS — I480 Paroxysmal atrial fibrillation: Secondary | ICD-10-CM | POA: Diagnosis not present

## 2020-11-24 DIAGNOSIS — I5022 Chronic systolic (congestive) heart failure: Secondary | ICD-10-CM | POA: Diagnosis not present

## 2020-11-24 DIAGNOSIS — G4733 Obstructive sleep apnea (adult) (pediatric): Secondary | ICD-10-CM | POA: Diagnosis not present

## 2020-12-10 DIAGNOSIS — G4733 Obstructive sleep apnea (adult) (pediatric): Secondary | ICD-10-CM | POA: Diagnosis not present

## 2020-12-24 DIAGNOSIS — R7303 Prediabetes: Secondary | ICD-10-CM | POA: Diagnosis not present

## 2020-12-24 DIAGNOSIS — I10 Essential (primary) hypertension: Secondary | ICD-10-CM | POA: Diagnosis not present

## 2020-12-24 DIAGNOSIS — J4540 Moderate persistent asthma, uncomplicated: Secondary | ICD-10-CM | POA: Diagnosis not present

## 2020-12-24 DIAGNOSIS — I480 Paroxysmal atrial fibrillation: Secondary | ICD-10-CM | POA: Diagnosis not present

## 2020-12-24 DIAGNOSIS — I5032 Chronic diastolic (congestive) heart failure: Secondary | ICD-10-CM | POA: Diagnosis not present

## 2020-12-24 DIAGNOSIS — Z0000 Encounter for general adult medical examination without abnormal findings: Secondary | ICD-10-CM | POA: Diagnosis not present

## 2020-12-24 DIAGNOSIS — E7849 Other hyperlipidemia: Secondary | ICD-10-CM | POA: Diagnosis not present

## 2021-01-11 ENCOUNTER — Encounter: Payer: Self-pay | Admitting: Internal Medicine

## 2021-02-10 DIAGNOSIS — M7552 Bursitis of left shoulder: Secondary | ICD-10-CM | POA: Diagnosis not present

## 2021-02-12 DIAGNOSIS — M79602 Pain in left arm: Secondary | ICD-10-CM | POA: Diagnosis not present

## 2021-02-12 DIAGNOSIS — M7989 Other specified soft tissue disorders: Secondary | ICD-10-CM | POA: Diagnosis not present

## 2021-02-19 ENCOUNTER — Ambulatory Visit: Payer: Medicare Other | Admitting: Gastroenterology

## 2021-02-23 DIAGNOSIS — I5022 Chronic systolic (congestive) heart failure: Secondary | ICD-10-CM | POA: Diagnosis not present

## 2021-02-23 DIAGNOSIS — G4733 Obstructive sleep apnea (adult) (pediatric): Secondary | ICD-10-CM | POA: Diagnosis not present

## 2021-02-23 DIAGNOSIS — I480 Paroxysmal atrial fibrillation: Secondary | ICD-10-CM | POA: Diagnosis not present

## 2021-02-23 DIAGNOSIS — I10 Essential (primary) hypertension: Secondary | ICD-10-CM | POA: Diagnosis not present

## 2021-03-12 DIAGNOSIS — Z1231 Encounter for screening mammogram for malignant neoplasm of breast: Secondary | ICD-10-CM | POA: Diagnosis not present

## 2021-03-25 DIAGNOSIS — I5032 Chronic diastolic (congestive) heart failure: Secondary | ICD-10-CM | POA: Diagnosis not present

## 2021-03-25 DIAGNOSIS — J4540 Moderate persistent asthma, uncomplicated: Secondary | ICD-10-CM | POA: Diagnosis not present

## 2021-03-25 DIAGNOSIS — I480 Paroxysmal atrial fibrillation: Secondary | ICD-10-CM | POA: Diagnosis not present

## 2021-03-25 DIAGNOSIS — Z0000 Encounter for general adult medical examination without abnormal findings: Secondary | ICD-10-CM | POA: Diagnosis not present

## 2021-03-25 DIAGNOSIS — E7849 Other hyperlipidemia: Secondary | ICD-10-CM | POA: Diagnosis not present

## 2021-03-31 ENCOUNTER — Encounter: Payer: Self-pay | Admitting: Internal Medicine

## 2021-03-31 ENCOUNTER — Ambulatory Visit: Payer: Medicare Other | Admitting: Gastroenterology

## 2021-06-17 DIAGNOSIS — E7849 Other hyperlipidemia: Secondary | ICD-10-CM | POA: Diagnosis not present

## 2021-06-17 DIAGNOSIS — I480 Paroxysmal atrial fibrillation: Secondary | ICD-10-CM | POA: Diagnosis not present

## 2021-06-17 DIAGNOSIS — I5032 Chronic diastolic (congestive) heart failure: Secondary | ICD-10-CM | POA: Diagnosis not present

## 2021-06-30 DIAGNOSIS — E7849 Other hyperlipidemia: Secondary | ICD-10-CM | POA: Diagnosis not present

## 2021-06-30 DIAGNOSIS — I480 Paroxysmal atrial fibrillation: Secondary | ICD-10-CM | POA: Diagnosis not present

## 2021-06-30 DIAGNOSIS — I10 Essential (primary) hypertension: Secondary | ICD-10-CM | POA: Diagnosis not present

## 2021-06-30 DIAGNOSIS — I5032 Chronic diastolic (congestive) heart failure: Secondary | ICD-10-CM | POA: Diagnosis not present

## 2021-06-30 DIAGNOSIS — R7303 Prediabetes: Secondary | ICD-10-CM | POA: Diagnosis not present

## 2021-06-30 DIAGNOSIS — J4540 Moderate persistent asthma, uncomplicated: Secondary | ICD-10-CM | POA: Diagnosis not present

## 2021-08-18 DIAGNOSIS — E7849 Other hyperlipidemia: Secondary | ICD-10-CM | POA: Diagnosis not present

## 2021-08-18 DIAGNOSIS — I5032 Chronic diastolic (congestive) heart failure: Secondary | ICD-10-CM | POA: Diagnosis not present

## 2021-08-18 DIAGNOSIS — I10 Essential (primary) hypertension: Secondary | ICD-10-CM | POA: Diagnosis not present

## 2021-08-25 DIAGNOSIS — H5213 Myopia, bilateral: Secondary | ICD-10-CM | POA: Diagnosis not present

## 2021-08-26 DIAGNOSIS — I480 Paroxysmal atrial fibrillation: Secondary | ICD-10-CM | POA: Diagnosis not present

## 2021-08-26 DIAGNOSIS — G4733 Obstructive sleep apnea (adult) (pediatric): Secondary | ICD-10-CM | POA: Diagnosis not present

## 2021-08-26 DIAGNOSIS — I5022 Chronic systolic (congestive) heart failure: Secondary | ICD-10-CM | POA: Diagnosis not present

## 2021-08-26 DIAGNOSIS — I10 Essential (primary) hypertension: Secondary | ICD-10-CM | POA: Diagnosis not present

## 2021-10-05 DIAGNOSIS — J4540 Moderate persistent asthma, uncomplicated: Secondary | ICD-10-CM | POA: Diagnosis not present

## 2021-10-05 DIAGNOSIS — I10 Essential (primary) hypertension: Secondary | ICD-10-CM | POA: Diagnosis not present

## 2021-10-05 DIAGNOSIS — I5032 Chronic diastolic (congestive) heart failure: Secondary | ICD-10-CM | POA: Diagnosis not present

## 2021-10-05 DIAGNOSIS — E7849 Other hyperlipidemia: Secondary | ICD-10-CM | POA: Diagnosis not present

## 2021-11-17 DIAGNOSIS — E7849 Other hyperlipidemia: Secondary | ICD-10-CM | POA: Diagnosis not present

## 2021-11-17 DIAGNOSIS — I10 Essential (primary) hypertension: Secondary | ICD-10-CM | POA: Diagnosis not present

## 2021-11-17 DIAGNOSIS — I5032 Chronic diastolic (congestive) heart failure: Secondary | ICD-10-CM | POA: Diagnosis not present

## 2021-11-29 DIAGNOSIS — Z1211 Encounter for screening for malignant neoplasm of colon: Secondary | ICD-10-CM | POA: Diagnosis not present

## 2022-01-06 DIAGNOSIS — J4540 Moderate persistent asthma, uncomplicated: Secondary | ICD-10-CM | POA: Diagnosis not present

## 2022-01-06 DIAGNOSIS — I10 Essential (primary) hypertension: Secondary | ICD-10-CM | POA: Diagnosis not present

## 2022-01-06 DIAGNOSIS — E7849 Other hyperlipidemia: Secondary | ICD-10-CM | POA: Diagnosis not present

## 2022-01-06 DIAGNOSIS — I5032 Chronic diastolic (congestive) heart failure: Secondary | ICD-10-CM | POA: Diagnosis not present

## 2022-01-06 DIAGNOSIS — R7303 Prediabetes: Secondary | ICD-10-CM | POA: Diagnosis not present

## 2022-01-06 DIAGNOSIS — Z0000 Encounter for general adult medical examination without abnormal findings: Secondary | ICD-10-CM | POA: Diagnosis not present

## 2022-02-15 DIAGNOSIS — I5032 Chronic diastolic (congestive) heart failure: Secondary | ICD-10-CM | POA: Diagnosis not present

## 2022-02-15 DIAGNOSIS — I10 Essential (primary) hypertension: Secondary | ICD-10-CM | POA: Diagnosis not present

## 2022-02-15 DIAGNOSIS — J4540 Moderate persistent asthma, uncomplicated: Secondary | ICD-10-CM | POA: Diagnosis not present

## 2022-02-15 DIAGNOSIS — E7849 Other hyperlipidemia: Secondary | ICD-10-CM | POA: Diagnosis not present

## 2022-03-07 DIAGNOSIS — I480 Paroxysmal atrial fibrillation: Secondary | ICD-10-CM | POA: Diagnosis not present

## 2022-03-07 DIAGNOSIS — I10 Essential (primary) hypertension: Secondary | ICD-10-CM | POA: Diagnosis not present

## 2022-03-07 DIAGNOSIS — G4733 Obstructive sleep apnea (adult) (pediatric): Secondary | ICD-10-CM | POA: Diagnosis not present

## 2022-03-07 DIAGNOSIS — I5022 Chronic systolic (congestive) heart failure: Secondary | ICD-10-CM | POA: Diagnosis not present

## 2022-04-11 DIAGNOSIS — E7849 Other hyperlipidemia: Secondary | ICD-10-CM | POA: Diagnosis not present

## 2022-04-11 DIAGNOSIS — J4540 Moderate persistent asthma, uncomplicated: Secondary | ICD-10-CM | POA: Diagnosis not present

## 2022-04-11 DIAGNOSIS — I10 Essential (primary) hypertension: Secondary | ICD-10-CM | POA: Diagnosis not present

## 2022-04-11 DIAGNOSIS — I5032 Chronic diastolic (congestive) heart failure: Secondary | ICD-10-CM | POA: Diagnosis not present

## 2022-04-11 DIAGNOSIS — Z0000 Encounter for general adult medical examination without abnormal findings: Secondary | ICD-10-CM | POA: Diagnosis not present

## 2022-07-05 DIAGNOSIS — M810 Age-related osteoporosis without current pathological fracture: Secondary | ICD-10-CM | POA: Diagnosis not present

## 2022-07-05 DIAGNOSIS — Z780 Asymptomatic menopausal state: Secondary | ICD-10-CM | POA: Diagnosis not present

## 2022-07-18 DIAGNOSIS — J4540 Moderate persistent asthma, uncomplicated: Secondary | ICD-10-CM | POA: Diagnosis not present

## 2022-07-18 DIAGNOSIS — I10 Essential (primary) hypertension: Secondary | ICD-10-CM | POA: Diagnosis not present

## 2022-07-18 DIAGNOSIS — E7849 Other hyperlipidemia: Secondary | ICD-10-CM | POA: Diagnosis not present

## 2022-07-18 DIAGNOSIS — I5032 Chronic diastolic (congestive) heart failure: Secondary | ICD-10-CM | POA: Diagnosis not present

## 2022-07-18 DIAGNOSIS — Z0000 Encounter for general adult medical examination without abnormal findings: Secondary | ICD-10-CM | POA: Diagnosis not present

## 2022-07-25 DIAGNOSIS — J40 Bronchitis, not specified as acute or chronic: Secondary | ICD-10-CM | POA: Diagnosis not present

## 2022-09-05 DIAGNOSIS — I5022 Chronic systolic (congestive) heart failure: Secondary | ICD-10-CM | POA: Diagnosis not present

## 2022-09-05 DIAGNOSIS — I10 Essential (primary) hypertension: Secondary | ICD-10-CM | POA: Diagnosis not present

## 2022-09-05 DIAGNOSIS — G4733 Obstructive sleep apnea (adult) (pediatric): Secondary | ICD-10-CM | POA: Diagnosis not present

## 2022-09-05 DIAGNOSIS — I480 Paroxysmal atrial fibrillation: Secondary | ICD-10-CM | POA: Diagnosis not present

## 2022-11-02 DIAGNOSIS — I5032 Chronic diastolic (congestive) heart failure: Secondary | ICD-10-CM | POA: Diagnosis not present

## 2022-11-02 DIAGNOSIS — I10 Essential (primary) hypertension: Secondary | ICD-10-CM | POA: Diagnosis not present

## 2022-11-02 DIAGNOSIS — J4540 Moderate persistent asthma, uncomplicated: Secondary | ICD-10-CM | POA: Diagnosis not present

## 2022-11-02 DIAGNOSIS — I480 Paroxysmal atrial fibrillation: Secondary | ICD-10-CM | POA: Diagnosis not present

## 2023-02-08 DIAGNOSIS — Z0000 Encounter for general adult medical examination without abnormal findings: Secondary | ICD-10-CM | POA: Diagnosis not present

## 2023-02-08 DIAGNOSIS — I5032 Chronic diastolic (congestive) heart failure: Secondary | ICD-10-CM | POA: Diagnosis not present

## 2023-02-08 DIAGNOSIS — J4540 Moderate persistent asthma, uncomplicated: Secondary | ICD-10-CM | POA: Diagnosis not present

## 2023-02-08 DIAGNOSIS — I10 Essential (primary) hypertension: Secondary | ICD-10-CM | POA: Diagnosis not present

## 2023-02-08 DIAGNOSIS — I480 Paroxysmal atrial fibrillation: Secondary | ICD-10-CM | POA: Diagnosis not present

## 2023-03-06 DIAGNOSIS — I10 Essential (primary) hypertension: Secondary | ICD-10-CM | POA: Diagnosis not present

## 2023-03-06 DIAGNOSIS — I5022 Chronic systolic (congestive) heart failure: Secondary | ICD-10-CM | POA: Diagnosis not present

## 2023-03-06 DIAGNOSIS — I480 Paroxysmal atrial fibrillation: Secondary | ICD-10-CM | POA: Diagnosis not present

## 2023-03-06 DIAGNOSIS — G4733 Obstructive sleep apnea (adult) (pediatric): Secondary | ICD-10-CM | POA: Diagnosis not present

## 2023-03-28 DIAGNOSIS — S46111A Strain of muscle, fascia and tendon of long head of biceps, right arm, initial encounter: Secondary | ICD-10-CM | POA: Diagnosis not present

## 2023-03-29 DIAGNOSIS — R0602 Shortness of breath: Secondary | ICD-10-CM | POA: Diagnosis not present

## 2023-05-24 DIAGNOSIS — I10 Essential (primary) hypertension: Secondary | ICD-10-CM | POA: Diagnosis not present

## 2023-05-24 DIAGNOSIS — I5032 Chronic diastolic (congestive) heart failure: Secondary | ICD-10-CM | POA: Diagnosis not present

## 2023-05-24 DIAGNOSIS — J4541 Moderate persistent asthma with (acute) exacerbation: Secondary | ICD-10-CM | POA: Diagnosis not present

## 2023-05-24 DIAGNOSIS — E7849 Other hyperlipidemia: Secondary | ICD-10-CM | POA: Diagnosis not present

## 2023-05-24 DIAGNOSIS — S46111A Strain of muscle, fascia and tendon of long head of biceps, right arm, initial encounter: Secondary | ICD-10-CM | POA: Diagnosis not present

## 2023-05-24 DIAGNOSIS — I480 Paroxysmal atrial fibrillation: Secondary | ICD-10-CM | POA: Diagnosis not present

## 2023-07-03 DIAGNOSIS — R5383 Other fatigue: Secondary | ICD-10-CM | POA: Diagnosis not present

## 2023-07-03 DIAGNOSIS — M65232 Calcific tendinitis, left forearm: Secondary | ICD-10-CM | POA: Diagnosis not present

## 2023-09-25 DIAGNOSIS — Z0000 Encounter for general adult medical examination without abnormal findings: Secondary | ICD-10-CM | POA: Diagnosis not present

## 2023-09-25 DIAGNOSIS — J4541 Moderate persistent asthma with (acute) exacerbation: Secondary | ICD-10-CM | POA: Diagnosis not present

## 2023-09-25 DIAGNOSIS — I5032 Chronic diastolic (congestive) heart failure: Secondary | ICD-10-CM | POA: Diagnosis not present

## 2023-09-25 DIAGNOSIS — E1169 Type 2 diabetes mellitus with other specified complication: Secondary | ICD-10-CM | POA: Diagnosis not present

## 2023-09-25 DIAGNOSIS — R5383 Other fatigue: Secondary | ICD-10-CM | POA: Diagnosis not present

## 2023-09-25 DIAGNOSIS — I480 Paroxysmal atrial fibrillation: Secondary | ICD-10-CM | POA: Diagnosis not present

## 2023-09-25 DIAGNOSIS — E7849 Other hyperlipidemia: Secondary | ICD-10-CM | POA: Diagnosis not present

## 2023-09-25 DIAGNOSIS — I10 Essential (primary) hypertension: Secondary | ICD-10-CM | POA: Diagnosis not present

## 2023-10-30 ENCOUNTER — Ambulatory Visit: Payer: 59 | Admitting: Obstetrics & Gynecology

## 2023-11-14 ENCOUNTER — Ambulatory Visit: Payer: 59 | Admitting: Obstetrics & Gynecology

## 2023-11-22 ENCOUNTER — Ambulatory Visit: Payer: 59 | Admitting: Internal Medicine

## 2023-11-29 ENCOUNTER — Ambulatory Visit: Payer: 59 | Admitting: Adult Health

## 2024-01-01 DIAGNOSIS — I10 Essential (primary) hypertension: Secondary | ICD-10-CM | POA: Diagnosis not present

## 2024-01-01 DIAGNOSIS — I5032 Chronic diastolic (congestive) heart failure: Secondary | ICD-10-CM | POA: Diagnosis not present

## 2024-01-01 DIAGNOSIS — E7849 Other hyperlipidemia: Secondary | ICD-10-CM | POA: Diagnosis not present

## 2024-01-01 DIAGNOSIS — L279 Dermatitis due to unspecified substance taken internally: Secondary | ICD-10-CM | POA: Diagnosis not present

## 2024-01-01 DIAGNOSIS — J4541 Moderate persistent asthma with (acute) exacerbation: Secondary | ICD-10-CM | POA: Diagnosis not present

## 2024-01-01 DIAGNOSIS — I480 Paroxysmal atrial fibrillation: Secondary | ICD-10-CM | POA: Diagnosis not present

## 2024-01-01 DIAGNOSIS — E1122 Type 2 diabetes mellitus with diabetic chronic kidney disease: Secondary | ICD-10-CM | POA: Diagnosis not present

## 2024-01-01 DIAGNOSIS — Z0000 Encounter for general adult medical examination without abnormal findings: Secondary | ICD-10-CM | POA: Diagnosis not present

## 2024-01-01 DIAGNOSIS — N182 Chronic kidney disease, stage 2 (mild): Secondary | ICD-10-CM | POA: Diagnosis not present

## 2024-01-03 ENCOUNTER — Ambulatory Visit: Payer: 59 | Admitting: Adult Health

## 2024-01-30 ENCOUNTER — Encounter (INDEPENDENT_AMBULATORY_CARE_PROVIDER_SITE_OTHER): Payer: Self-pay | Admitting: *Deleted

## 2024-02-27 DIAGNOSIS — Z1231 Encounter for screening mammogram for malignant neoplasm of breast: Secondary | ICD-10-CM | POA: Diagnosis not present

## 2024-03-18 DIAGNOSIS — M7711 Lateral epicondylitis, right elbow: Secondary | ICD-10-CM | POA: Diagnosis not present

## 2024-03-18 DIAGNOSIS — I10 Essential (primary) hypertension: Secondary | ICD-10-CM | POA: Diagnosis not present

## 2024-04-17 DIAGNOSIS — I5032 Chronic diastolic (congestive) heart failure: Secondary | ICD-10-CM | POA: Diagnosis not present

## 2024-04-17 DIAGNOSIS — N182 Chronic kidney disease, stage 2 (mild): Secondary | ICD-10-CM | POA: Diagnosis not present

## 2024-04-17 DIAGNOSIS — M7711 Lateral epicondylitis, right elbow: Secondary | ICD-10-CM | POA: Diagnosis not present

## 2024-04-17 DIAGNOSIS — E7849 Other hyperlipidemia: Secondary | ICD-10-CM | POA: Diagnosis not present

## 2024-04-17 DIAGNOSIS — I10 Essential (primary) hypertension: Secondary | ICD-10-CM | POA: Diagnosis not present

## 2024-04-17 DIAGNOSIS — J4540 Moderate persistent asthma, uncomplicated: Secondary | ICD-10-CM | POA: Diagnosis not present

## 2024-04-17 DIAGNOSIS — E1122 Type 2 diabetes mellitus with diabetic chronic kidney disease: Secondary | ICD-10-CM | POA: Diagnosis not present

## 2024-04-17 DIAGNOSIS — I480 Paroxysmal atrial fibrillation: Secondary | ICD-10-CM | POA: Diagnosis not present

## 2024-04-17 DIAGNOSIS — Z0000 Encounter for general adult medical examination without abnormal findings: Secondary | ICD-10-CM | POA: Diagnosis not present

## 2024-05-23 DIAGNOSIS — H40033 Anatomical narrow angle, bilateral: Secondary | ICD-10-CM | POA: Diagnosis not present

## 2024-05-23 DIAGNOSIS — H5203 Hypermetropia, bilateral: Secondary | ICD-10-CM | POA: Diagnosis not present

## 2024-07-01 ENCOUNTER — Encounter: Payer: Self-pay | Admitting: Adult Health

## 2024-07-01 ENCOUNTER — Ambulatory Visit: Admitting: Adult Health

## 2024-07-01 ENCOUNTER — Other Ambulatory Visit (HOSPITAL_COMMUNITY)
Admission: RE | Admit: 2024-07-01 | Discharge: 2024-07-01 | Disposition: A | Discharge status: Home / Self Care | Source: Ambulatory Visit | Attending: Adult Health | Admitting: Adult Health

## 2024-07-01 VITALS — BP 164/95 | HR 108 | Ht 62.0 in | Wt 290.5 lb

## 2024-07-01 DIAGNOSIS — I10 Essential (primary) hypertension: Secondary | ICD-10-CM

## 2024-07-01 DIAGNOSIS — Z1331 Encounter for screening for depression: Secondary | ICD-10-CM

## 2024-07-01 DIAGNOSIS — L900 Lichen sclerosus et atrophicus: Secondary | ICD-10-CM | POA: Diagnosis not present

## 2024-07-01 DIAGNOSIS — Z1151 Encounter for screening for human papillomavirus (HPV): Secondary | ICD-10-CM | POA: Diagnosis not present

## 2024-07-01 DIAGNOSIS — Z1211 Encounter for screening for malignant neoplasm of colon: Secondary | ICD-10-CM

## 2024-07-01 DIAGNOSIS — Z01419 Encounter for gynecological examination (general) (routine) without abnormal findings: Secondary | ICD-10-CM | POA: Diagnosis not present

## 2024-07-01 LAB — HEMOCCULT GUIAC POC 1CARD (OFFICE): Fecal Occult Blood, POC: NEGATIVE

## 2024-07-01 MED ORDER — CLOBETASOL PROPIONATE 0.05 % EX CREA: TOPICAL_CREAM | CUTANEOUS | 2 refills | Status: AC

## 2024-07-01 NOTE — Progress Notes (Signed)
 Patient ID: [name], female   DOB: September 06, 1966, 58 y.o.   MRN: [id number] History of Present Illness: [name] is a 58 year old black female, married, PM, in for a well woman gyn exam and pap. Last pap about 10 years ago.  PCP is Dr Orpha  Current Medications, Allergies, Past Medical History, Past Surgical History, Family History and Social History were reviewed in [employer] electronic medical record.     Review of Systems: Patient denies any headaches, hearing loss, fatigue, blurred vision, shortness of breath, chest pain, abdominal pain, problems with bowel movements, urination, or intercourse(not active) . No joint pain or mood swings.  Denies any vaginal bleeding    Physical Exam:BP (!) 164/95 (BP Location: Left Arm, Patient Position: Sitting, Cuff Size: Large)   Pulse (!) 108   Ht 5' 2 (1.575 m)   Wt 290 lb 8 oz (131.8 kg)   LMP  (LMP Unknown)   BMI 53.13 kg/m   General:  Well developed, well nourished, no acute distress Skin:  Warm and dry Neck:  Midline trachea, normal thyroid , good ROM, no lymphadenopathy Lungs; Clear to auscultation bilaterally Breast:  No dominant palpable mass, retraction, or nipple discharge Cardiovascular: Regular rate and rhythm Abdomen:  Soft, non tender, no hepatosplenomegaly Pelvic:  External genitalia is normal in appearance, vulva skin is thickened, and skin near anus is thickened and darkened in color(does itch at times).  The vagina is pale. Urethra has no lesions or masses. The cervix is smooth, pap with high risk HPV performed. Uterus is felt to be normal size, shape, and contour.  No adnexal masses or tenderness noted.Bladder is non tender, no masses felt. Rectal: Good sphincter tone, no polyps, or hemorrhoids felt.  Hemoccult negative. Extremities/musculoskeletal:  No swelling or varicosities noted, no clubbing or cyanosis Psych:  No mood changes, alert and cooperative,seems happy AA is 0 Fall risk is low    07/01/2024    9:58  AM 08/04/2017   10:46 AM 08/04/2017   10:45 AM  Depression screen PHQ 2/9  Decreased Interest 0 3 3  Down, Depressed, Hopeless 0 2 2  PHQ - 2 Score 0 5 5  Altered sleeping 1 3   Tired, decreased energy 0 3   Change in appetite 0 3   Feeling bad or failure about yourself  0 0   Trouble concentrating 0 1   Moving slowly or fidgety/restless 0 0   Suicidal thoughts 0 0   PHQ-9 Score 1 15   Difficult doing work/chores  Somewhat difficult        07/01/2024    9:58 AM  GAD 7 : Generalized Anxiety Score  Nervous, Anxious, on Edge 0  Control/stop worrying 0  Worry too much - different things 0  Trouble relaxing 0  Restless 0  Easily annoyed or irritable 0  Afraid - awful might happen 0  Total GAD 7 Score 0    Upstream - 07/01/24 0956       Pregnancy Intention Screening   Does the patient want to become pregnant in the next year? N/A    Does the patient's partner want to become pregnant in the next year? N/A    Would the patient like to discuss contraceptive options today? N/A      Contraception Wrap Up   Current Method Abstinence;No Method - Other Reason   PM   End Method Abstinence;No Method - Other Reason   PM   Contraception Counseling Provided No  Examination chaperoned by Clarita Salt LPN    Impression and plan: 1. Encounter for gynecological examination with Papanicolaou smear of cervix (Primary) Pap sent Pap in 3 years if negative Physical in 1 year  - Cytology - PAP( [facility]) Labs with PCP Had cologuard that was negative Mammogram was negative 02/27/24 at [facility]   2. Encounter for screening fecal occult blood testing Hemoccult was negative  - POCT occult blood stool  3. Hypertension, unspecified type Take meds and follow up with PCP  4. Lichen sclerosus et atrophicus Has thickened skin labia and near anal area Will rx temovate  2-3 x weekly  Meds ordered this encounter  Medications   clobetasol  cream (TEMOVATE ) 0.05 %    Sig: Apply  2-3 x weekly to external tissues of affected area    Dispense:  45 g    Refill:  2    Supervising Provider:   JAYNE MINDER H [[id number]]

## 2024-07-08 ENCOUNTER — Ambulatory Visit: Payer: Self-pay | Admitting: Women's Health

## 2024-07-08 LAB — CYTOLOGY - PAP
Adequacy: ABSENT
Comment: NEGATIVE
Diagnosis: NEGATIVE
High risk HPV: NEGATIVE

## 2024-07-11 DIAGNOSIS — E1122 Type 2 diabetes mellitus with diabetic chronic kidney disease: Secondary | ICD-10-CM | POA: Diagnosis not present

## 2024-07-11 DIAGNOSIS — I5032 Chronic diastolic (congestive) heart failure: Secondary | ICD-10-CM | POA: Diagnosis not present

## 2024-07-11 DIAGNOSIS — I10 Essential (primary) hypertension: Secondary | ICD-10-CM | POA: Diagnosis not present

## 2024-07-11 DIAGNOSIS — E7849 Other hyperlipidemia: Secondary | ICD-10-CM | POA: Diagnosis not present

## 2024-07-11 DIAGNOSIS — J4540 Moderate persistent asthma, uncomplicated: Secondary | ICD-10-CM | POA: Diagnosis not present

## 2024-07-11 DIAGNOSIS — I480 Paroxysmal atrial fibrillation: Secondary | ICD-10-CM | POA: Diagnosis not present

## 2024-07-11 DIAGNOSIS — N182 Chronic kidney disease, stage 2 (mild): Secondary | ICD-10-CM | POA: Diagnosis not present

## 2024-07-12 ENCOUNTER — Emergency Department (HOSPITAL_COMMUNITY)

## 2024-07-12 ENCOUNTER — Emergency Department (HOSPITAL_COMMUNITY)
Admission: EM | Admit: 2024-07-12 | Discharge: 2024-07-12 | Disposition: A | Discharge status: Home / Self Care | Attending: Emergency Medicine | Admitting: Emergency Medicine

## 2024-07-12 ENCOUNTER — Encounter (HOSPITAL_COMMUNITY): Payer: Self-pay | Admitting: Emergency Medicine

## 2024-07-12 ENCOUNTER — Other Ambulatory Visit: Payer: Self-pay

## 2024-07-12 DIAGNOSIS — Z7901 Long term (current) use of anticoagulants: Secondary | ICD-10-CM | POA: Diagnosis not present

## 2024-07-12 DIAGNOSIS — I10 Essential (primary) hypertension: Secondary | ICD-10-CM | POA: Diagnosis not present

## 2024-07-12 DIAGNOSIS — R7989 Other specified abnormal findings of blood chemistry: Secondary | ICD-10-CM | POA: Diagnosis not present

## 2024-07-12 DIAGNOSIS — R0602 Shortness of breath: Secondary | ICD-10-CM | POA: Diagnosis not present

## 2024-07-12 DIAGNOSIS — R600 Localized edema: Secondary | ICD-10-CM | POA: Insufficient documentation

## 2024-07-12 DIAGNOSIS — I509 Heart failure, unspecified: Secondary | ICD-10-CM | POA: Diagnosis not present

## 2024-07-12 DIAGNOSIS — I110 Hypertensive heart disease with heart failure: Secondary | ICD-10-CM | POA: Diagnosis not present

## 2024-07-12 DIAGNOSIS — I517 Cardiomegaly: Secondary | ICD-10-CM | POA: Diagnosis not present

## 2024-07-12 LAB — BASIC METABOLIC PANEL WITH GFR
Anion gap: 7 (ref 5–15)
BUN: 14 mg/dL (ref 6–20)
CO2: 27 mmol/L (ref 22–32)
Calcium: 8.8 mg/dL — ABNORMAL LOW (ref 8.9–10.3)
Chloride: 104 mmol/L (ref 98–111)
Creatinine, Ser: 1.17 mg/dL — ABNORMAL HIGH (ref 0.44–1.00)
GFR, Estimated: 54 mL/min — ABNORMAL LOW (ref >60)
Glucose, Bld: 133 mg/dL — ABNORMAL HIGH (ref 70–99)
Potassium: 3.4 mmol/L — ABNORMAL LOW (ref 3.5–5.1)
Sodium: 138 mmol/L (ref 135–145)

## 2024-07-12 LAB — CBC
HCT: 38.4 % (ref 36.0–46.0)
Hemoglobin: 12.3 g/dL (ref 12.0–15.0)
MCH: 29.6 pg (ref 26.0–34.0)
MCHC: 32 g/dL (ref 30.0–36.0)
MCV: 92.5 fL (ref 80.0–100.0)
Platelets: 237 K/uL (ref 150–400)
RBC: 4.15 MIL/uL (ref 3.87–5.11)
RDW: 13.7 % (ref 11.5–15.5)
WBC: 5.2 K/uL (ref 4.0–10.5)
nRBC: 0 % (ref 0.0–0.2)

## 2024-07-12 LAB — TROPONIN I (HIGH SENSITIVITY): Troponin I (High Sensitivity): 19 ng/L — ABNORMAL HIGH (ref <18)

## 2024-07-12 LAB — BRAIN NATRIURETIC PEPTIDE: B Natriuretic Peptide: 379 pg/mL — ABNORMAL HIGH (ref 0.0–100.0)

## 2024-07-12 MED ORDER — POTASSIUM CHLORIDE 20 MEQ PO PACK
60.0000 meq | PACK | Freq: Once | ORAL | Status: AC
  Administered 2024-07-12: 60 meq via ORAL
  Filled 2024-07-12: qty 3

## 2024-07-12 MED ORDER — FUROSEMIDE 10 MG/ML IJ SOLN
80.0000 mg | Freq: Once | INTRAMUSCULAR | Status: AC
  Administered 2024-07-12: 80 mg via INTRAVENOUS
  Filled 2024-07-12: qty 8

## 2024-07-12 NOTE — ED Triage Notes (Signed)
 Pt states she has been SOB. States that she hasn't been able to sleep x 1 week. Reports increased SOB when lying down. Saw PCP yesterday and was told that she had fluid onboard and was given a new fluid pill (spironolactone) but pt reports she hasn't been urinating with it and still SOB.

## 2024-07-12 NOTE — ED Provider Notes (Addendum)
 [place] EMERGENCY DEPARTMENT AT [facility] Provider Note   CSN: [id number] Arrival date & time: 07/12/24  9643     Patient presents with: Shortness of Breath   [name] is a 58 y.o. female.   Patient presents to the emergency department for evaluation of shortness of breath.  Patient reports that over the past week she has been experiencing progressively worsening shortness of breath and swelling of her legs.  She saw her doctor yesterday and he started spironolactone in addition to her torsemide.  She reports that she is having trouble sleeping at night because she gets more short of breath when she lies flat.       Prior to Admission medications   Medication Sig Start Date End Date Taking? Authorizing Provider  benazepril  (LOTENSIN ) 40 MG tablet Take 40 mg by mouth daily.     [provider]  carvedilol  (COREG ) 25 MG tablet Take 25 mg by mouth 2 (two) times daily.    [provider]  cetirizine  (ZYRTEC ) 10 MG tablet Take 10 mg by mouth. 04/05/16   [provider]  clobetasol  cream (TEMOVATE ) 0.05 % Apply 2-3 x weekly to external tissues of affected area 07/01/24   Signa Delon LABOR, NP  colestipol (COLESTID) 1 g tablet Take by mouth. 06/11/24   [provider]  ELIQUIS 5 MG TABS tablet Take 5 mg by mouth 2 (two) times daily.    [provider]  Ferrous Sulfate  (IRON ) 325 (65 Fe) MG TABS Take 2 daily 08/04/17   Signa Delon A, NP  Fluticasone-Salmeterol (ADVAIR) 100-50 MCG/DOSE AEPB Inhale 1-2 puffs into the lungs 2 (two) times daily.    [provider]  Fluticasone-Umeclidin-Vilant (TRELEGY ELLIPTA) 100-62.5-25 MCG/ACT AEPB Inhale into the lungs.    [provider]  furosemide  (LASIX ) 40 MG tablet Take 40 mg by mouth 2 (two) times daily.     [provider]  guaiFENesin -codeine  100-10 MG/5ML syrup Take 10 mLs by mouth. 04/05/16   [provider]  hydrALAZINE  (APRESOLINE ) 25 MG  tablet Take 25 mg by mouth 2 (two) times daily.    [provider]  labetalol  (NORMODYNE ) 300 MG tablet Take 1 tablet (300 mg total) by mouth 2 (two) times daily. 11/11/18   Knapp, Iva, MD  olmesartan (BENICAR) 40 MG tablet Take 40 mg by mouth daily.    [provider]  potassium chloride  SA (KLOR-CON  M) 20 MEQ tablet Take 20 mEq by mouth daily.    [provider]  rosuvastatin (CRESTOR) 20 MG tablet Take 20 mg by mouth at bedtime.    [provider]  RYBELSUS 14 MG TABS Take 1 tablet by mouth every morning.    [provider]  torsemide (DEMADEX) 20 MG tablet Take 20 mg by mouth daily.    [provider]  Potassium Gluconate 595 MG CAPS Take 1 each by mouth daily.    03/08/12  [provider]    Allergies: Orlistat, Metrizamide, and Iodine    Review of Systems  Updated Vital Signs BP (!) 160/125   Pulse 90   Temp 97.6 F (36.4 C) (Oral)   Resp (!) 22   Ht 5' 2 (1.575 m)   Wt 132 kg   LMP  (LMP Unknown)   SpO2 96%   BMI 53.23 kg/m   Physical Exam Vitals and nursing note reviewed.  Constitutional:      General: She is not in acute distress.    Appearance: She  is well-developed.  HENT:     Head: Normocephalic and atraumatic.     Mouth/Throat:     Mouth: Mucous membranes are moist.  Eyes:     General: Vision grossly intact. Gaze aligned appropriately.     Extraocular Movements: Extraocular movements intact.     Conjunctiva/sclera: Conjunctivae normal.  Cardiovascular:     Rate and Rhythm: Normal rate and regular rhythm.     Pulses: Normal pulses.     Heart sounds: Normal heart sounds, S1 normal and S2 normal. No murmur heard.    No friction rub. No gallop.  Pulmonary:     Effort: Pulmonary effort is normal. No respiratory distress.     Breath sounds: Examination of the right-lower field reveals rales. Examination of the left-lower field reveals rales. Rales (Faint, at bases) present.  Abdominal:     General:  Bowel sounds are normal.     Palpations: Abdomen is soft.     Tenderness: There is no abdominal tenderness. There is no guarding or rebound.     Hernia: No hernia is present.  Musculoskeletal:        General: No swelling.     Cervical back: Full passive range of motion without pain, normal range of motion and neck supple. No spinous process tenderness or muscular tenderness. Normal range of motion.     Right lower leg: Edema present.     Left lower leg: Edema present.  Skin:    General: Skin is warm and dry.     Capillary Refill: Capillary refill takes less than 2 seconds.     Findings: No ecchymosis, erythema, rash or wound.  Neurological:     General: No focal deficit present.     Mental Status: She is alert and oriented to person, place, and time.     GCS: GCS eye subscore is 4. GCS verbal subscore is 5. GCS motor subscore is 6.     Cranial Nerves: Cranial nerves 2-12 are intact.     Sensory: Sensation is intact.     Motor: Motor function is intact.     Coordination: Coordination is intact.  Psychiatric:        Attention and Perception: Attention normal.        Mood and Affect: Mood normal.        Speech: Speech normal.        Behavior: Behavior normal.     (all labs ordered are listed, but only abnormal results are displayed) Labs Reviewed  BASIC METABOLIC PANEL WITH GFR - Abnormal; Notable for the following components:      Result Value   Potassium 3.4 (*)    Glucose, Bld 133 (*)    Creatinine, Ser 1.17 (*)    Calcium 8.8 (*)    GFR, Estimated 54 (*)    All other components within normal limits  BRAIN NATRIURETIC PEPTIDE - Abnormal; Notable for the following components:   B Natriuretic Peptide 379.0 (*)    All other components within normal limits  TROPONIN I (HIGH SENSITIVITY) - Abnormal; Notable for the following components:   Troponin I (High Sensitivity) 19 (*)    All other components within normal limits  CBC    EKG: EKG Interpretation Date/Time:  Friday  July 12 2024 04:17:58 EDT Ventricular Rate:  91 PR Interval:  156 QRS Duration:  97 QT Interval:  364 QTC Calculation: 448 R Axis:   4  Text Interpretation: Sinus rhythm Atrial premature complexes in couplets Low voltage, precordial leads Borderline  T wave abnormalities No significant change since last tracing Confirmed by Haze Lonni PARAS [id number]) on 07/12/2024 4:21:23 AM  Radiology: DG Chest Portable 1 View Result Date: 07/12/2024 CLINICAL DATA:  Shortness of breath and hypertension. EXAM: PORTABLE CHEST 1 VIEW COMPARISON:  PA Lat chest 11/11/2016. FINDINGS: Limited view of the lower lung zones due to habitus. The heart is moderately enlarged. There is mild central vascular fullness but no overt edema is seen. No substantial effusion. The mediastinum is normally outlined. The lungs appear generally clear. Slight S shaped thoracic scoliosis. IMPRESSION: Moderate cardiomegaly with mild central vascular fullness but no overt edema. No substantial effusion. No other evidence of acute chest process. Electronically Signed   By: Francis Quam M.D.   On: 07/12/2024 04:37     Procedures   Medications Ordered in the ED  furosemide  (LASIX ) injection 80 mg (80 mg Intravenous Given 07/12/24 0459)                                    Medical Decision Making Amount and/or Complexity of Data Reviewed Labs: ordered. Radiology: ordered.  Risk Prescription drug management.   Differential Diagnosis considered includes, but not limited to: URI; pneumonia; chf  Presents to Emergency Department for evaluation of shortness of breath with leg swelling.  Patient does have a history of congestive heart failure.  She saw her doctor this week and had spironolactone added to her torsemide.  She reports that she is still experiencing swelling and shortness of breath.  Patient is in no acute distress.  She is not hypoxic.  Chest x-ray shows mild vascular congestion, no overt edema.  BNP is elevated.   Troponin is 19, likely leak from heart failure.  She is not experiencing any chest pain.  EKG without ischemic changes.  Patient appears comfortable, will aggressively hydrate here in the ED.  As she is not requiring oxygen I do not feel that she requires hospitalization at this time.  She was given IV Lasix  and has diuresed.  Will discharge with several days of increased torsemide and follow-up with primary care.  Given return precautions.  Potassium was 3.4 here.  Will give potassium prior to discharge.  I will hold off on adding potassium to her regimen because she was started on spironolactone.    Final diagnoses:  Acute on chronic congestive heart failure, unspecified heart failure type [facility])    ED Discharge Orders     None          Haze Lonni PARAS, MD 07/12/24 9389    Haze Lonni PARAS, MD 07/12/24 [phone]

## 2024-07-12 NOTE — Discharge Instructions (Signed)
 Take 2 of your torsemide tablets daily for the next 3 days then back to your normal dosing.  Follow-up with Dr. Orpha in a week for a recheck.  Return to the ER for chest pain or worsening swelling and shortness of breath.

## 2024-07-29 ENCOUNTER — Encounter (INDEPENDENT_AMBULATORY_CARE_PROVIDER_SITE_OTHER): Payer: Self-pay | Admitting: *Deleted

## 2024-08-04 ENCOUNTER — Other Ambulatory Visit: Payer: Self-pay

## 2024-08-04 ENCOUNTER — Encounter (HOSPITAL_COMMUNITY): Payer: Self-pay

## 2024-08-04 ENCOUNTER — Emergency Department (HOSPITAL_COMMUNITY)
Admission: EM | Admit: 2024-08-04 | Discharge: 2024-08-04 | Disposition: A | Discharge status: Home / Self Care | Attending: Emergency Medicine | Admitting: Emergency Medicine

## 2024-08-04 DIAGNOSIS — I110 Hypertensive heart disease with heart failure: Secondary | ICD-10-CM | POA: Insufficient documentation

## 2024-08-04 DIAGNOSIS — Z79899 Other long term (current) drug therapy: Secondary | ICD-10-CM | POA: Insufficient documentation

## 2024-08-04 DIAGNOSIS — J45909 Unspecified asthma, uncomplicated: Secondary | ICD-10-CM | POA: Insufficient documentation

## 2024-08-04 DIAGNOSIS — I10 Essential (primary) hypertension: Secondary | ICD-10-CM

## 2024-08-04 DIAGNOSIS — Z7901 Long term (current) use of anticoagulants: Secondary | ICD-10-CM | POA: Diagnosis not present

## 2024-08-04 DIAGNOSIS — I509 Heart failure, unspecified: Secondary | ICD-10-CM | POA: Diagnosis not present

## 2024-08-04 MED ORDER — LABETALOL HCL 100 MG PO TABS: 100.0000 mg | ORAL_TABLET | Freq: Two times a day (BID) | ORAL | 2 refills | Status: AC

## 2024-08-04 MED ORDER — LABETALOL HCL 200 MG PO TABS
100.0000 mg | ORAL_TABLET | Freq: Once | ORAL | Status: AC
  Administered 2024-08-04: 100 mg via ORAL
  Filled 2024-08-04: qty 1

## 2024-08-04 NOTE — ED Provider Notes (Signed)
 [place] EMERGENCY DEPARTMENT AT [facility] Provider Note   CSN: [id number] Arrival date & time: 08/04/24  0145     Patient presents with: Hypertension   [name] is a 58 y.o. female.    Hypertension  Patient presents for high blood pressure.  Medical history includes CHF, HTN, anemia, asthma.  She states that her blood pressure was previously well-controlled.  Her primary care doctor took her off of blood pressure medication and she has since had increasing blood pressure readings at home.  She denies any recent symptoms other than feeling jittery.  Blood pressure at home today was in the range of 190s SBP.  For this reason, she presents to the ED.     Prior to Admission medications   Medication Sig Start Date End Date Taking? Authorizing Provider  labetalol  (NORMODYNE ) 100 MG tablet Take 1 tablet (100 mg total) by mouth 2 (two) times daily. 08/04/24  Yes Melvenia Motto, MD  benazepril  (LOTENSIN ) 40 MG tablet Take 40 mg by mouth daily.     [provider]  carvedilol  (COREG ) 25 MG tablet Take 25 mg by mouth 2 (two) times daily.    [provider]  cetirizine  (ZYRTEC ) 10 MG tablet Take 10 mg by mouth. 04/05/16   [provider]  clobetasol  cream (TEMOVATE ) 0.05 % Apply 2-3 x weekly to external tissues of affected area 07/01/24   Signa Delon LABOR, NP  colestipol (COLESTID) 1 g tablet Take by mouth. 06/11/24   [provider]  ELIQUIS 5 MG TABS tablet Take 5 mg by mouth 2 (two) times daily.    [provider]  Ferrous Sulfate  (IRON ) 325 (65 Fe) MG TABS Take 2 daily 08/04/17   Signa Delon A, NP  Fluticasone-Salmeterol (ADVAIR) 100-50 MCG/DOSE AEPB Inhale 1-2 puffs into the lungs 2 (two) times daily.    [provider]  Fluticasone-Umeclidin-Vilant (TRELEGY ELLIPTA) 100-62.5-25 MCG/ACT AEPB Inhale into the lungs.    [provider]  furosemide  (LASIX ) 40 MG tablet Take 40 mg by mouth 2 (two) times daily.      [provider]  guaiFENesin -codeine  100-10 MG/5ML syrup Take 10 mLs by mouth. 04/05/16   [provider]  hydrALAZINE  (APRESOLINE ) 25 MG tablet Take 25 mg by mouth 2 (two) times daily.    [provider]  labetalol  (NORMODYNE ) 300 MG tablet Take 1 tablet (300 mg total) by mouth 2 (two) times daily. 11/11/18   Knapp, Iva, MD  olmesartan (BENICAR) 40 MG tablet Take 40 mg by mouth daily.    [provider]  potassium chloride  SA (KLOR-CON  M) 20 MEQ tablet Take 20 mEq by mouth daily.    [provider]  rosuvastatin (CRESTOR) 20 MG tablet Take 20 mg by mouth at bedtime.    [provider]  RYBELSUS 14 MG TABS Take 1 tablet by mouth every morning.    [provider]  torsemide (DEMADEX) 20 MG tablet Take 20 mg by mouth daily.    [provider]  Potassium Gluconate 595 MG CAPS Take 1 each by mouth daily.    03/08/12  [provider]    Allergies: Orlistat, Metrizamide, and Iodine    Review of Systems  Psychiatric/Behavioral:  The patient is nervous/anxious.   All other systems reviewed and are negative.   Updated Vital Signs BP (!) 168/111 (BP Location: Left Arm)   Pulse 99   Temp 97.7 F (36.5 C) (Oral)   Resp 20   Ht 5'  1 (1.549 m)   Wt 129.3 kg   LMP  (LMP Unknown)   SpO2 98%   BMI 53.85 kg/m   Physical Exam Vitals and nursing note reviewed.  Constitutional:      General: She is not in acute distress.    Appearance: Normal appearance. She is well-developed. She is not ill-appearing, toxic-appearing or diaphoretic.  HENT:     Head: Normocephalic and atraumatic.     Right Ear: External ear normal.     Left Ear: External ear normal.     Nose: Nose normal.     Mouth/Throat:     Mouth: Mucous membranes are moist.  Eyes:     Extraocular Movements: Extraocular movements intact.     Conjunctiva/sclera: Conjunctivae normal.  Cardiovascular:     Rate and Rhythm: Normal rate and regular rhythm.   Pulmonary:     Effort: Pulmonary effort is normal. No respiratory distress.  Abdominal:     General: There is no distension.     Palpations: Abdomen is soft.  Musculoskeletal:        General: No swelling. Normal range of motion.     Cervical back: Normal range of motion and neck supple.  Skin:    General: Skin is warm and dry.     Coloration: Skin is not jaundiced or pale.  Neurological:     General: No focal deficit present.     Mental Status: She is alert and oriented to person, place, and time.     Cranial Nerves: No cranial nerve deficit.     Sensory: No sensory deficit.     Motor: No weakness.     Coordination: Coordination normal.  Psychiatric:        Mood and Affect: Mood normal.        Behavior: Behavior normal.     (all labs ordered are listed, but only abnormal results are displayed) Labs Reviewed - No data to display  EKG: None  Radiology: No results found.   Procedures   Medications Ordered in the ED  labetalol  (NORMODYNE ) tablet 100 mg (has no administration in time range)                                    Medical Decision Making  Patient presents for elevated blood pressure readings at home.  She denies any recent symptoms other than feeling jittery and anxious.  Blood pressure on arrival was in the range of 160/110.  Current home medications include olmesartan, Coreg , and torsemide.  She has previously been prescribed hydralazine  and labetalol .  She feels like her blood pressure was well-controlled on labetalol .  She is in favor of restarting this medication.  Previously, she was on 300 mg twice daily.  Patient was given prescription for 100 mg tablets.  She was advised to started an initial dose of 100 mg twice daily and monitor her blood pressure at home.  Patient to follow-up with PCP for ongoing long-term management.  At this time, she is stable for discharge.     Final diagnoses:  Hypertension, unspecified type    ED Discharge Orders           Ordered    labetalol  (NORMODYNE ) 100 MG tablet  2 times daily        08/04/24 0210               Melvenia Motto, MD 08/04/24 [phone]

## 2024-08-04 NOTE — ED Notes (Signed)
 AVS with discharge instructions provided by edp was reviewed with the pt. The pt was able to verbalize understanding of instructions, verified pharmacy, and verbalized follow up with PCP next week. Pt denies any additional questions. Going home with family at bedside

## 2024-08-04 NOTE — Discharge Instructions (Signed)
 A prescription for labetalol  was sent to your pharmacy.  Take this in addition to your other blood pressure medications.  You were previously on a 300 mg dose.  Reinitiate this medication at 100 mg twice a day and continue to monitor your blood pressure at home.  Talk to your primary care doctor about ongoing management of your blood pressure and other medications.  Return to the emergency department for any new or worsening symptoms of concern.

## 2024-08-04 NOTE — ED Triage Notes (Signed)
 Patient from home for high blood pressure x2 days. Reports BP medication was changed 2 months ago and reports her BP has been slowly increasing since then. Denies pain. Upon arrival to ER, patient is alert and oriented, ambu

## 2024-08-13 DIAGNOSIS — I10 Essential (primary) hypertension: Secondary | ICD-10-CM | POA: Diagnosis not present

## 2024-08-13 DIAGNOSIS — I5031 Acute diastolic (congestive) heart failure: Secondary | ICD-10-CM | POA: Diagnosis not present

## 2024-08-13 DIAGNOSIS — J4540 Moderate persistent asthma, uncomplicated: Secondary | ICD-10-CM | POA: Diagnosis not present

## 2024-08-13 DIAGNOSIS — I480 Paroxysmal atrial fibrillation: Secondary | ICD-10-CM | POA: Diagnosis not present

## 2024-08-13 DIAGNOSIS — N181 Chronic kidney disease, stage 1: Secondary | ICD-10-CM | POA: Diagnosis not present

## 2024-08-13 DIAGNOSIS — E1122 Type 2 diabetes mellitus with diabetic chronic kidney disease: Secondary | ICD-10-CM | POA: Diagnosis not present

## 2024-08-13 DIAGNOSIS — E7849 Other hyperlipidemia: Secondary | ICD-10-CM | POA: Diagnosis not present

## 2024-08-13 DIAGNOSIS — N182 Chronic kidney disease, stage 2 (mild): Secondary | ICD-10-CM | POA: Diagnosis not present

## 2024-08-21 DIAGNOSIS — R0602 Shortness of breath: Secondary | ICD-10-CM | POA: Diagnosis not present

## 2024-08-27 ENCOUNTER — Encounter (INDEPENDENT_AMBULATORY_CARE_PROVIDER_SITE_OTHER): Payer: Self-pay | Admitting: *Deleted

## 2024-09-09 DIAGNOSIS — N182 Chronic kidney disease, stage 2 (mild): Secondary | ICD-10-CM | POA: Diagnosis not present

## 2024-09-09 DIAGNOSIS — J4540 Moderate persistent asthma, uncomplicated: Secondary | ICD-10-CM | POA: Diagnosis not present

## 2024-09-09 DIAGNOSIS — I480 Paroxysmal atrial fibrillation: Secondary | ICD-10-CM | POA: Diagnosis not present

## 2024-09-09 DIAGNOSIS — I10 Essential (primary) hypertension: Secondary | ICD-10-CM | POA: Diagnosis not present

## 2024-09-09 DIAGNOSIS — E7849 Other hyperlipidemia: Secondary | ICD-10-CM | POA: Diagnosis not present

## 2024-09-09 DIAGNOSIS — E1122 Type 2 diabetes mellitus with diabetic chronic kidney disease: Secondary | ICD-10-CM | POA: Diagnosis not present

## 2024-09-09 DIAGNOSIS — I5042 Chronic combined systolic (congestive) and diastolic (congestive) heart failure: Secondary | ICD-10-CM | POA: Diagnosis not present

## 2024-09-30 DIAGNOSIS — G4733 Obstructive sleep apnea (adult) (pediatric): Secondary | ICD-10-CM | POA: Diagnosis not present

## 2024-09-30 DIAGNOSIS — I509 Heart failure, unspecified: Secondary | ICD-10-CM | POA: Diagnosis not present
